# Patient Record
Sex: Female | Born: 1980 | Race: White | Hispanic: No | Marital: Married | Smoking: Former smoker
Health system: Southern US, Community
[De-identification: ages and names within clinical notes are randomized; demographics above are authoritative.]

## PROBLEM LIST (undated history)

## (undated) DIAGNOSIS — E119 Type 2 diabetes mellitus without complications: Secondary | ICD-10-CM

## (undated) DIAGNOSIS — J329 Chronic sinusitis, unspecified: Secondary | ICD-10-CM

## (undated) DIAGNOSIS — E663 Overweight: Secondary | ICD-10-CM

## (undated) DIAGNOSIS — F329 Major depressive disorder, single episode, unspecified: Secondary | ICD-10-CM

## (undated) DIAGNOSIS — I10 Essential (primary) hypertension: Secondary | ICD-10-CM

## (undated) DIAGNOSIS — F419 Anxiety disorder, unspecified: Secondary | ICD-10-CM

## (undated) DIAGNOSIS — I219 Acute myocardial infarction, unspecified: Secondary | ICD-10-CM

## (undated) DIAGNOSIS — F32A Depression, unspecified: Secondary | ICD-10-CM

## (undated) DIAGNOSIS — K219 Gastro-esophageal reflux disease without esophagitis: Secondary | ICD-10-CM

## (undated) DIAGNOSIS — M1990 Unspecified osteoarthritis, unspecified site: Secondary | ICD-10-CM

## (undated) DIAGNOSIS — G4730 Sleep apnea, unspecified: Secondary | ICD-10-CM

## (undated) HISTORY — DX: Type 2 diabetes mellitus without complications: E11.9

## (undated) HISTORY — DX: Morbid (severe) obesity due to excess calories: E66.01

## (undated) HISTORY — DX: Chronic sinusitis, unspecified: J32.9

## (undated) HISTORY — DX: Unspecified osteoarthritis, unspecified site: M19.90

## (undated) HISTORY — DX: Depression, unspecified: F32.A

## (undated) HISTORY — DX: Essential (primary) hypertension: I10

## (undated) HISTORY — DX: Overweight: E66.3

## (undated) HISTORY — DX: Sleep apnea, unspecified: G47.30

## (undated) HISTORY — DX: Major depressive disorder, single episode, unspecified: F32.9

## (undated) HISTORY — DX: Anxiety disorder, unspecified: F41.9

## (undated) HISTORY — DX: Gastro-esophageal reflux disease without esophagitis: K21.9

---

## 2005-12-10 ENCOUNTER — Emergency Department (HOSPITAL_COMMUNITY): Admission: EM | Admit: 2005-12-10 | Discharge: 2005-12-10 | Payer: Self-pay | Admitting: Emergency Medicine

## 2007-05-28 ENCOUNTER — Ambulatory Visit: Payer: Self-pay | Admitting: Internal Medicine

## 2007-05-28 LAB — CONVERTED CEMR LAB
Basophils Absolute: 0 10*3/uL (ref 0.0–0.1)
Eosinophils Absolute: 0.1 10*3/uL (ref 0.0–0.6)
MCHC: 35.4 g/dL (ref 30.0–36.0)
MCV: 81.4 fL (ref 78.0–100.0)
Monocytes Relative: 5.9 % (ref 3.0–11.0)
RBC: 4.49 M/uL (ref 3.87–5.11)
RDW: 12.3 % (ref 11.5–14.6)
Sed Rate: 19 mm/hr (ref 0–25)

## 2007-05-30 ENCOUNTER — Ambulatory Visit: Payer: Self-pay | Admitting: Internal Medicine

## 2007-05-30 ENCOUNTER — Encounter: Payer: Self-pay | Admitting: Internal Medicine

## 2009-09-14 ENCOUNTER — Encounter: Payer: Self-pay | Admitting: Cardiology

## 2009-09-14 HISTORY — PX: US ECHOCARDIOGRAPHY: HXRAD669

## 2010-04-17 IMAGING — CT CT ABD-PELV W/O CM
2 of 5 series · 13 of 42 positions shown, 19 images · non-contrast
Comparison: NONE

CLINICAL DATA: Jim:Dainiusn Pigaga, M.D. Flank pain.  Left lower 
quadrant  pain. 

CT ABDOMEN AND PELVIS WITHOUT INTRAVENOUS BUT FOLLOWING ORAL 
CONTRAST
TECHNIQUE: Multiple axial images were obtained from the diaphragm 
through the pelvis.

[Series 2: wo · axial · 0.73mm/px · z∈[+352,+732]mm · 10 of 94 slices shown, 16 images]
[im 9/94  soft-tissue]
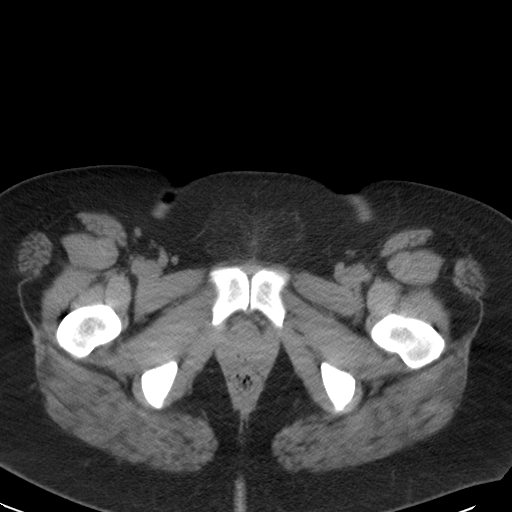
[im 9/94  bone]
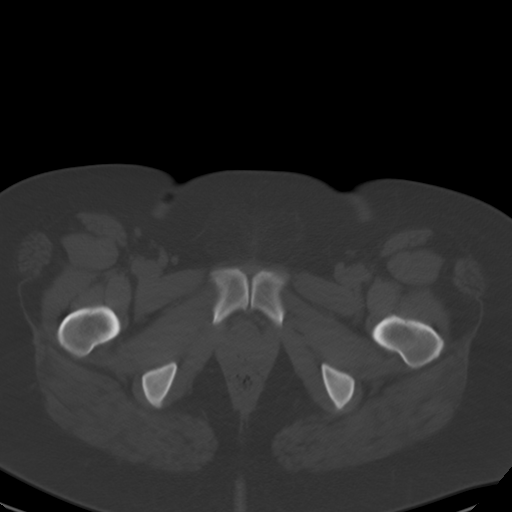
[im 17/94  soft-tissue]
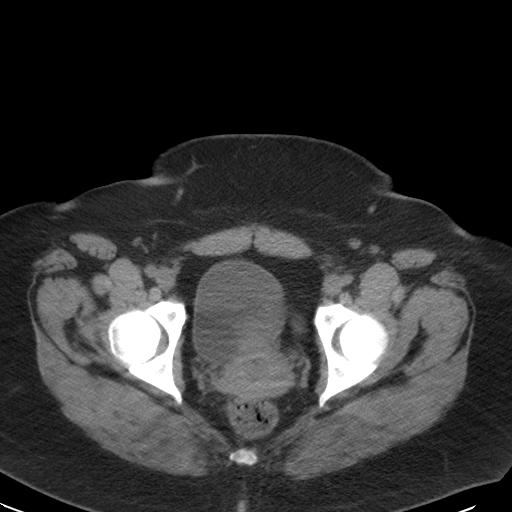
[im 26/94  soft-tissue]
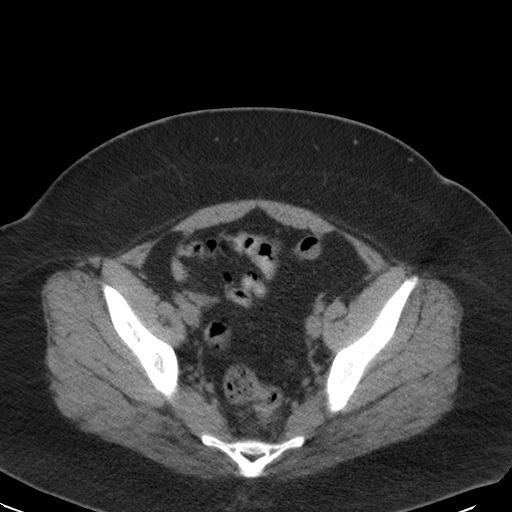
[im 34/94  soft-tissue]
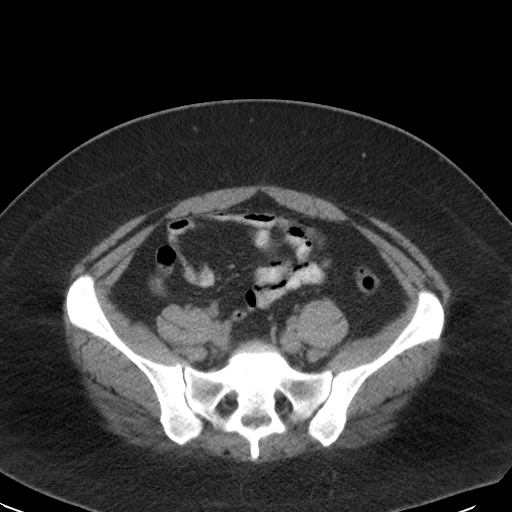
[im 43/94  soft-tissue]
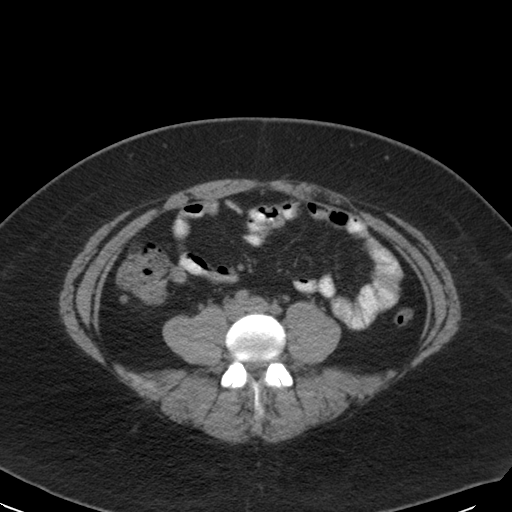
[im 51/94  soft-tissue]
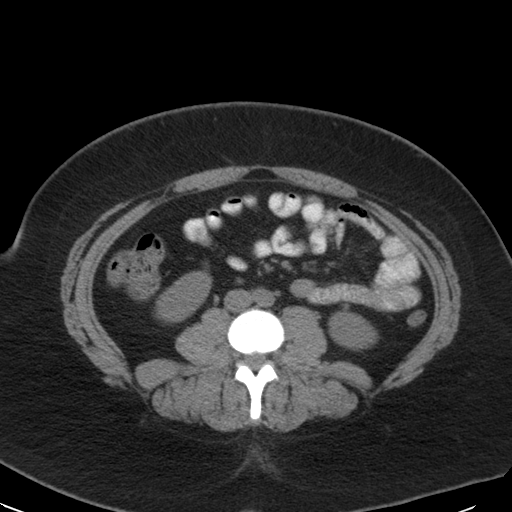
[im 60/94  soft-tissue]
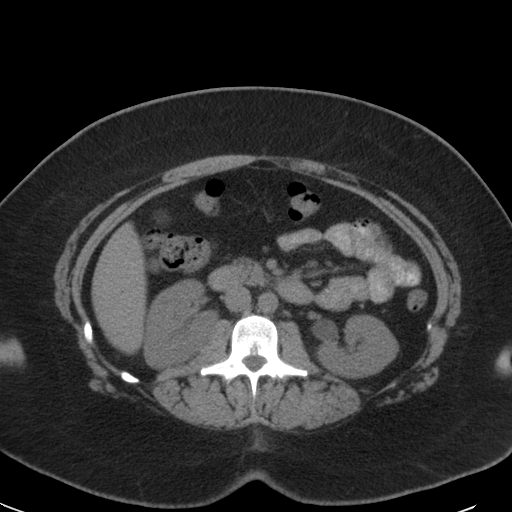
[im 60/94  lung]
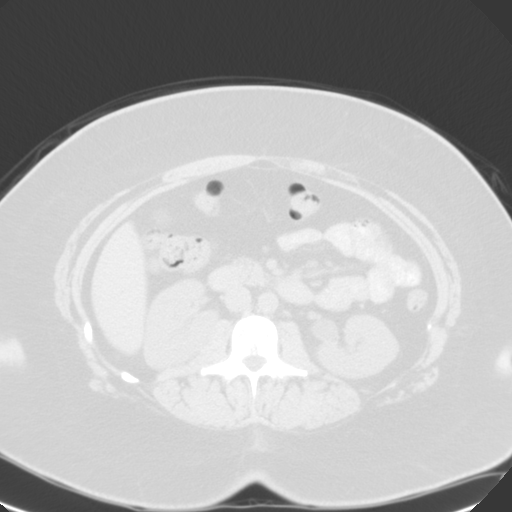
[im 68/94  soft-tissue]
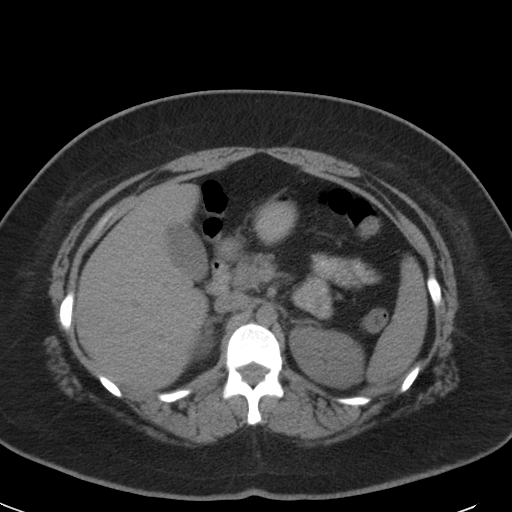
[im 68/94  lung]
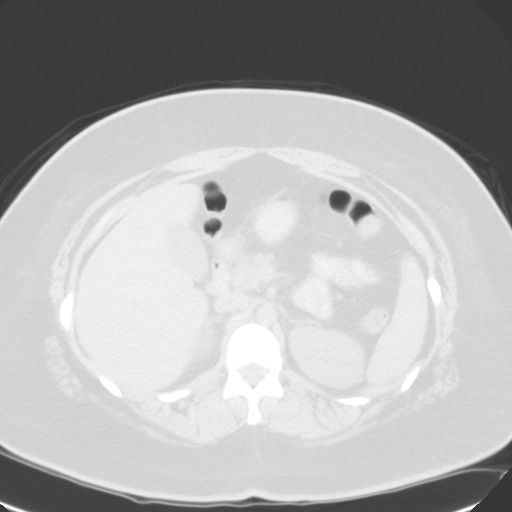
[im 77/94  soft-tissue]
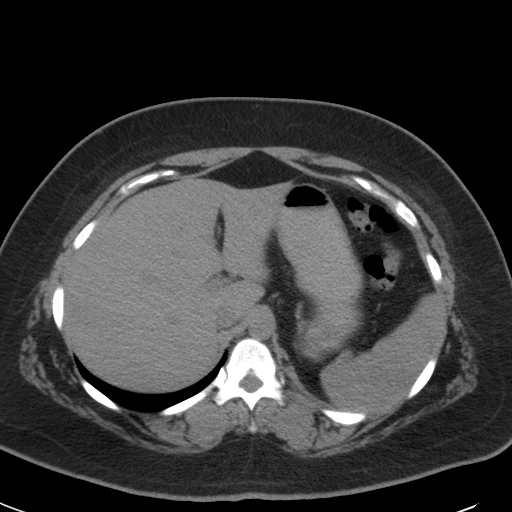
[im 77/94  lung]
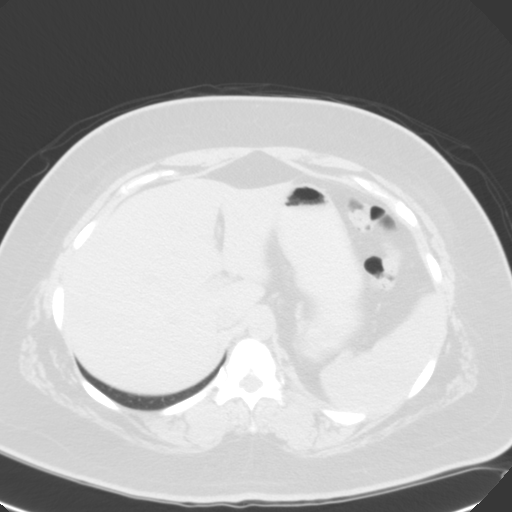
[im 77/94  bone]
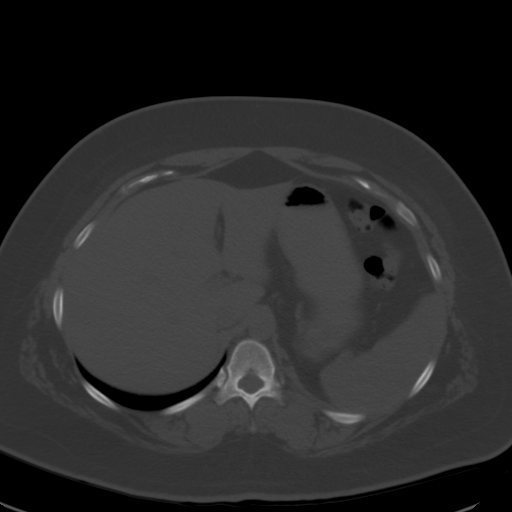
[im 85/94  soft-tissue]
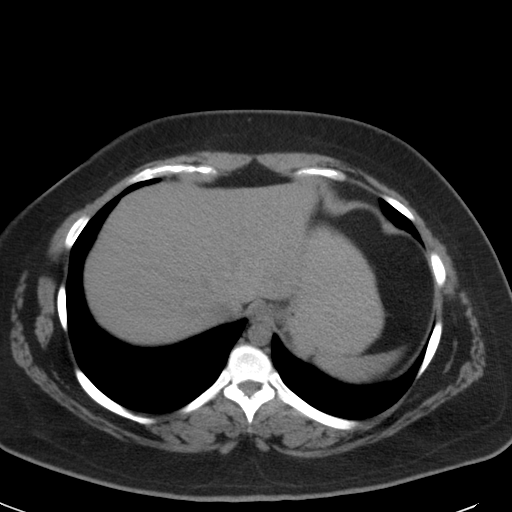
[im 85/94  lung]
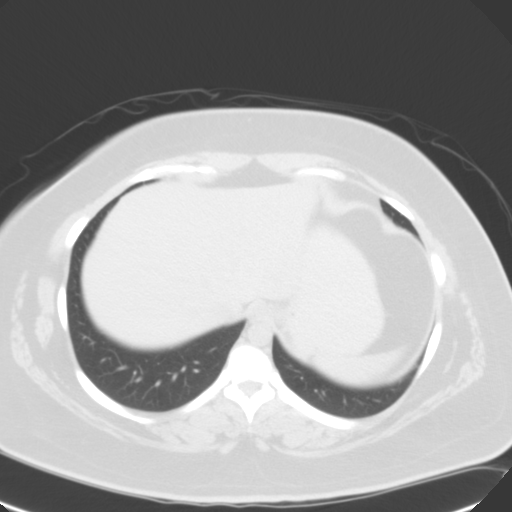

[coronals · coronal · 0.91mm/px · 3 of 75 slices shown]
[im 25/75  soft-tissue]
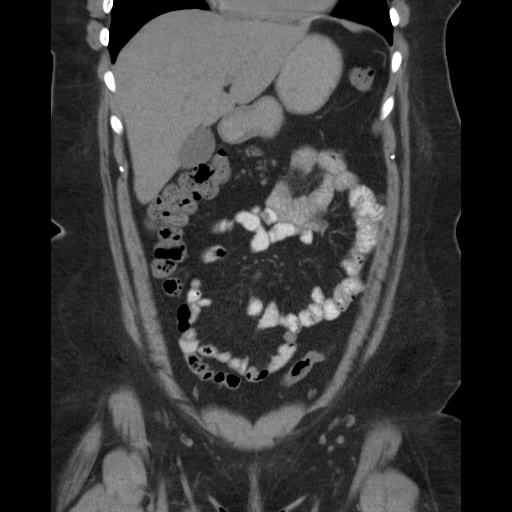
[im 33/75  soft-tissue]
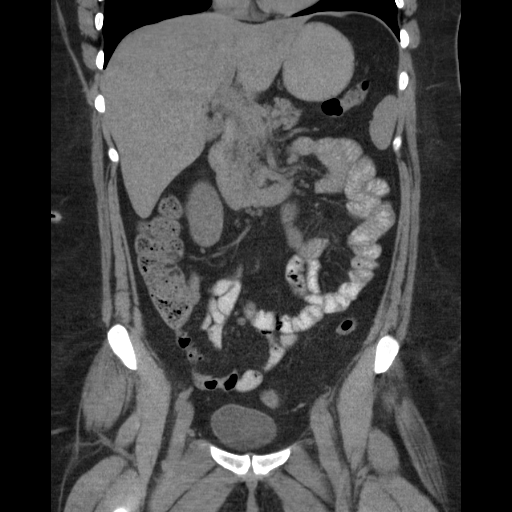
[im 42/75  soft-tissue]
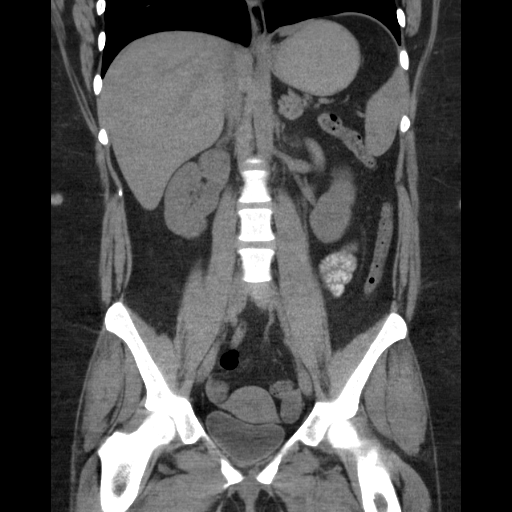

[13 of 42 positions shown; findings below may reference images not displayed]

FINDINGS: No gallstones or renal calculi are identified.  Liver, 
pancreas, spleen, kidneys, and aorta are unremarkable.  No bowel, 
mesenteric, pelvic, or inguinal mass, adenopathy, or inflammatory 
process.  No evidence of appendicitis, diverticulitis, hernia, or 
bowel obstruction.  No lung base mass, infiltrate, edema, or 
effusion.  No lytic or blastic lesions are identified. 

electronically reviewed on 06/04/2008 Dict Date: 06/04/2008  Tran 
Date:  06/04/2008 DAS  JLM

## 2010-09-27 ENCOUNTER — Encounter
Admission: RE | Admit: 2010-09-27 | Discharge: 2010-11-07 | Payer: Self-pay | Source: Home / Self Care | Attending: Obstetrics and Gynecology | Admitting: Obstetrics and Gynecology

## 2010-11-21 ENCOUNTER — Ambulatory Visit: Payer: Self-pay | Admitting: Dietician

## 2010-12-06 ENCOUNTER — Encounter: Payer: 59 | Attending: Obstetrics and Gynecology | Admitting: Dietician

## 2011-01-22 ENCOUNTER — Ambulatory Visit: Payer: 59 | Admitting: Dietician

## 2011-02-20 NOTE — Assessment & Plan Note (Signed)
[facility]                         GASTROENTEROLOGY OFFICE NOTE   Helen, Mcdaniel                       MRN:          [id number]  DATE:05/28/2007                            DOB:          [birth year]    GI CONSULTATION:  [name] is a 30 year old white female who is here  for evaluation of abdominal pain and diarrhea.  She saw Dr. Luther Parody in  April 2005 briefly and was thought to have a constipation-prone  irritable bowel syndrome.  Interestingly, she since then has developed  diarrhea, having 2 or 3 bowel movements a day, also at night.  She  denies any rectal bleeding.  Diarrhea is worse during her periods, which  have changed from lasting 4-5 days to lasting about 10 days.  She also  had frequent urination.  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  Unfortunately, she has to take personal time  ever time she runs to the bathroom, which seems to interfere with her  work.  Her weight has gradually increased.  In high school the patient  weighed 190 pounds.  She is currently 235 pounds.  She describes herself  as a rather nervous, anxious person, although she denies stress.  She  has been married recently and her husband is also overweight, and they  both eat out a lot.  She does not smoke, does not drink alcohol.   MEDICATIONS:  Benicar for blood pressure.   PAST HISTORY:  1. High blood pressure.  2. Asthmatic bronchitis.  3. Anxiety.  4. Depression.  5. Allergies.   She has never had any operations.   FAMILY HISTORY:  Heart disease in her father.  Breast cancer on mother's  side of the family.   SOCIAL HISTORY:  Married with no children.  She works at a Paediatric nurse.  She has a high school education.  The  patient stopped smoking in 2005.  She drinks alcohol very rarely.   REVIEW OF SYSTEMS:  Positive for being overweight, allergies, urination  changes, sleeping problems,  excessive thirst, vision changes, heart  rhythm problems, pain with periods, back pain, excessive urination,  severe fatigue, shortness of breath, muscle pains, and new depression.   PHYSICAL EXAMINATION:  Blood pressure 128/92, pulse 72, weight 235  pounds.  The patient was rather, nervous, anxious, but very pleasant and  cooperative.  She spoke incessantly.  SKIN:  Warm and dry.  There were no stigmata of chronic liver disease.  Sclerae were nonicteric.  Oral cavity was normal.  NECK:  Supple.  Thyroid was not enlarged.  LUNGS:  Clear to auscultation.  CARDIAC:  Normal S1, normal S2.  ABDOMEN:  Obese, soft, but very tender in the left lower quadrant and  left middle quadrant.  Normal exam in the epigastrium and right upper  and lower quadrants.  Liver edge was at the costal margin.  RECTAL:  Normal rectal tone.  Normal perianal area.  Stool was soft,  Hemoccult-negative.  EXTREMITIES:  No edema.   Abdominal ultrasound recently was normal.  IMPRESSION:  A 30 year old white female with alternating bowel habits  with predominant diarrhea, which is consistent with irritable bowel  syndrome.  This has been persistent for at least two years and the  patient is quite concerned about the possibility of inflammatory bowel  disease.  There is no family history of it.  Other possible explanations  would be celiac sprue but in the setting of obesity, it is highly  unlikely.  Her abdominal pain may be related to irritable bowel syndrome  but with the changing of her periods, one would consider the possibility  of endometriosis.  This needs to be further evaluated by a gynecologist.  She has not had one.  We have received a copy of the consultation report  from Dr. Luther Parody, and I was surprised to see that she actually saw him  for constipation rather than for diarrhea, which confirms my assumption  that we are truly dealing with irritable bowel syndrome.   PLAN:  1. We will proceed with  colonoscopy and mucosal biopsies to rule out      collagenous or microcytic colitis.  2. Will check a stool for lactoferrin and sedimentation rate as well      as CBC and tissue transglutaminase level.  3. Begin Bentyl 20 mg three times a day before meals.  This is for      antispasmodic effect.  She may eventually also need antianxiety or      psychotropic medications, but I would hold off on it at this time.  4. Gynecological evaluation for possible endometriosis and change in      the periods.     Hedwig Morton. Juanda Chance, MD  Electronically Signed    DMB/MedQ  DD: 05/28/2007  DT: 05/29/2007  Job #: [id number]   cc:   Harrel Lemon. Merla Riches, M.D.

## 2011-10-31 ENCOUNTER — Encounter (HOSPITAL_COMMUNITY): Payer: Self-pay | Admitting: Licensed Clinical Social Worker

## 2011-10-31 ENCOUNTER — Ambulatory Visit (INDEPENDENT_AMBULATORY_CARE_PROVIDER_SITE_OTHER): Payer: 59 | Admitting: Licensed Clinical Social Worker

## 2011-10-31 DIAGNOSIS — F4322 Adjustment disorder with anxiety: Secondary | ICD-10-CM
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
## 2011-11-08 ENCOUNTER — Ambulatory Visit (HOSPITAL_COMMUNITY): Payer: Self-pay | Admitting: Licensed Clinical Social Worker

## 2011-11-16 ENCOUNTER — Ambulatory Visit (INDEPENDENT_AMBULATORY_CARE_PROVIDER_SITE_OTHER): Payer: 59 | Admitting: Licensed Clinical Social Worker

## 2011-11-16 DIAGNOSIS — F411 Generalized anxiety disorder: Secondary | ICD-10-CM

## 2011-11-16 DIAGNOSIS — F419 Anxiety disorder, unspecified: Secondary | ICD-10-CM

## 2011-11-16 NOTE — Patient Instructions (Signed)
Talk with husband about having dinner together, looking into weight watchers program on computer, some exercise program, perhaps considering Dr. Referral for PT. Continue to find out cause of sinus problems Look around at the possibility of another apartment - purpose getting out of mold.If can't move right away - at least find an area you would like to live in.

## 2011-11-23 ENCOUNTER — Ambulatory Visit (INDEPENDENT_AMBULATORY_CARE_PROVIDER_SITE_OTHER): Payer: 59 | Admitting: Licensed Clinical Social Worker

## 2011-11-23 ENCOUNTER — Encounter (HOSPITAL_COMMUNITY): Payer: Self-pay | Admitting: Licensed Clinical Social Worker

## 2011-11-23 DIAGNOSIS — F4322 Adjustment disorder with anxiety: Secondary | ICD-10-CM

## 2011-11-23 NOTE — Patient Instructions (Signed)
Decide on a weight loss plan Work out bills by calling to make arrangements Continue to encourage husband to spend time with you Exercise when you can - first focus is getting food under control

## 2011-11-25 DIAGNOSIS — F4322 Adjustment disorder with anxiety: Secondary | ICD-10-CM | POA: Insufficient documentation
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
## 2011-12-04 ENCOUNTER — Encounter (HOSPITAL_COMMUNITY): Payer: Self-pay | Admitting: Licensed Clinical Social Worker

## 2011-12-04 ENCOUNTER — Ambulatory Visit (INDEPENDENT_AMBULATORY_CARE_PROVIDER_SITE_OTHER): Payer: 59 | Admitting: Licensed Clinical Social Worker

## 2011-12-04 DIAGNOSIS — F4322 Adjustment disorder with anxiety: Secondary | ICD-10-CM

## 2011-12-04 NOTE — Progress Notes (Signed)
   THERAPIST PROGRESS NOTE  Session Time: 3:00 - 4:00  Participation Level: Active  Behavioral Response: CasualAlertAnxious and Euthymic  Type of Therapy: Individual Therapy  Treatment Goals addressed: Anxiety  Interventions: Motivational Interviewing, Strength-based and Supportive  Summary: Helen Mcdaniel is a 31 y.o. female who presents with anxiety.  Today [name] reports that she is feeling less anxious and she feels that is because she feels that she has some attention; We discussed how when she first came she was (still is) working at home, not going out with friends and her husband was busy with school or his video games.  She was pretty lonely.[name] Her husband and she have been spending more time together.  They have specific TV shoes they watch and they have been eating together - they were not even eating together.   Her focus in this session was on her extended family.  She misses them a lot.  Her mother keeps her posted as to what is going on but she has trouble talking directly to her siblings for everyone is too busy.  She especially likes to connect with [name] and her brother and she has trouble with that because her brother does not keep in touch well and his wife is difficult to get through.  [name] also has a husband that keeps others away.  She has not sepent a Christmas with her family in quite awhile and she definitely wants to this year.  Her parents come down once a year but her siblings do not come so she has to see them by going up to [place].[name]  Her husband works Engineering geologist so it is hard to get away during holidays.  She is going to make more of an effort.  She loves [name] except that her family are not here.  Her husband is from her so he has his family close by.  She likes his mother but sometimes [name] does not understand her humor and takes the wrong things personally. She is continuing to work on a food plan to lose weight for she would like to have a child and she is considered a high  risk for a pregnancy.  .  Suicidal/Homicidal: Nowithout intent/plan  Plan: Return again in 1 weeks.  Diagnosis: Axis I: Adjustment Disorder with Anxiety    Axis II: Deferred    [name],JUDITH A, LCSW 12/04/2011

## 2011-12-07 NOTE — Progress Notes (Signed)
   THERAPIST PROGRESS NOTE  Session Time: 3:00 - 4:00  Participation Level: Active  Behavioral Response: CasualAlertAnxious and Euthymic  Type of Therapy: Individual Therapy  Treatment Goals addressed: Anxiety  Interventions: Motivational Interviewing, Supportive and Reframing  Summary: Helen Mcdaniel is a 31 y.o. female who presents with anxiety from stress in her life.  Husband having difficulties because of having two back surgeries and he is obese.  She discussed how little time they have together because when he is home he is playing video games and they don't even eat together.  Discussed how their relationship has been over the years and that they need to get back in touch with each other.  She sounds like she is lonely.  She works mainly at home so has limited contact with others and she and her husband are distant.  Because of his back issues and her chronic allergies and sinusitis they only have sex once or ticwice a mont.  They do want to have children one day.  She feels that they do not have any emotional issues with each other just bad habits. He is going to school and hopes to be finished in 3 semesters. The mold in their mobile home is really causing her to have problems.  She needs to look into how to protect herself from this mold.  They own this mobile home but they rent the land.  They are planning to break down the home and get rid of it when it is paid off. It is hard to figure out what she can do in the meantime - she should at least see if there is a way to get rid of the mold or some way to protect herself..  .   Suicidal/Homicidal: Nowithout intent/plan  Plan: Return again in 1 weeks.  Diagnosis: Axis I: Adjustment Disorder with Anxiety    Axis II: Deferred    Helen Mcdaniel,JUDITH A, LCSW 12/07/2011

## 2011-12-12 ENCOUNTER — Encounter (HOSPITAL_COMMUNITY): Payer: Self-pay | Admitting: Licensed Clinical Social Worker

## 2011-12-12 ENCOUNTER — Ambulatory Visit (INDEPENDENT_AMBULATORY_CARE_PROVIDER_SITE_OTHER): Payer: 59 | Admitting: Licensed Clinical Social Worker

## 2011-12-12 DIAGNOSIS — F4322 Adjustment disorder with anxiety: Secondary | ICD-10-CM

## 2011-12-13 ENCOUNTER — Encounter: Payer: Self-pay | Admitting: *Deleted

## 2011-12-20 NOTE — Progress Notes (Signed)
   THERAPIST PROGRESS NOTE  Session Time: 3:05 - 4:00  Participation Level: Active  Behavioral Response: CasualAlertAnxious and Euthymic  Type of Therapy: Individual Therapy  Treatment Goals addressed: Anxiety  Interventions: Motivational Interviewing, Strength-based and Supportive  Summary: RABECKA BRENDEL is a 31 y.o. female who presents with anxiety in relation to extended family and work.   Today Yazmen is much calmer.  The situation with her father is stable - it is a matter of clearing out toxins from the damage to his liver.  He is still yellow but home and improving. She wishes her family was closer - she likes communicating with siblings - perhaps more than they do - she is the youngest and sometimes feels like a pest - may come from earlier childhood.  She and her husband are spending more time together which is positive.  Eating together and watching TV.  He does not seem as interested in losing weight as she is - it would be more productive if they could do together.  They are both obese and it would be helpful to their health and the process they are thinking about which is having children.  There are watching TV and eating together.  He realized he was in a bad habit.  She is now able to pursue another job in her company and she is pleased with that prospect.  She is excited about new possibilities.  She likes working for [employer].  In terms of original crisis she is complete but she would like to continue in therapy.  Discussed how she need to define the problems she would like to work on and we could re: contract for time together.  Believe she will have issues around weight loss.     Suicidal/Homicidal: Nowithout intent/plan  Plan: Return again in 2 weeks.  Diagnosis: Axis I: Anxiety Disorder NOS    Axis II: Deferred    Cate Oravec,JUDITH A, LCSW 12/20/2011

## 2012-02-06 ENCOUNTER — Ambulatory Visit (HOSPITAL_COMMUNITY): Payer: Self-pay | Admitting: Licensed Clinical Social Worker

## 2012-03-08 ENCOUNTER — Encounter: Payer: Self-pay | Admitting: Cardiology

## 2012-07-02 ENCOUNTER — Other Ambulatory Visit: Payer: Self-pay | Admitting: Internal Medicine

## 2012-07-03 NOTE — Telephone Encounter (Signed)
Chart pulled to PA pool at nurse station [phone]

## 2012-11-10 ENCOUNTER — Ambulatory Visit: Payer: 59 | Admitting: Emergency Medicine

## 2012-11-10 VITALS — BP 140/80 | HR 83 | Temp 97.1°F | Resp 16 | Ht 62.0 in | Wt 256.2 lb

## 2012-11-10 DIAGNOSIS — F411 Generalized anxiety disorder: Secondary | ICD-10-CM

## 2012-11-10 DIAGNOSIS — F419 Anxiety disorder, unspecified: Secondary | ICD-10-CM

## 2012-11-10 DIAGNOSIS — G4733 Obstructive sleep apnea (adult) (pediatric): Secondary | ICD-10-CM

## 2012-11-10 DIAGNOSIS — M545 Low back pain: Secondary | ICD-10-CM

## 2012-11-10 DIAGNOSIS — R300 Dysuria: Secondary | ICD-10-CM

## 2012-11-10 LAB — POCT UA - MICROSCOPIC ONLY
Bacteria, U Microscopic: NEGATIVE
Casts, Ur, LPF, POC: NEGATIVE
Crystals, Ur, HPF, POC: NEGATIVE
Mucus, UA: NEGATIVE
Yeast, UA: NEGATIVE

## 2012-11-10 LAB — POCT URINALYSIS DIPSTICK
Bilirubin, UA: NEGATIVE
Blood, UA: NEGATIVE
Glucose, UA: NEGATIVE
Ketones, UA: NEGATIVE
Leukocytes, UA: NEGATIVE
Nitrite, UA: NEGATIVE
Protein, UA: NEGATIVE
Spec Grav, UA: 1.02
Urobilinogen, UA: 0.2
pH, UA: 7.5

## 2012-11-10 MED ORDER — SERTRALINE HCL 50 MG PO TABS: 50.0000 mg | ORAL_TABLET | Freq: Every day | ORAL | Status: DC

## 2012-11-10 MED ORDER — ALBUTEROL SULFATE HFA 108 (90 BASE) MCG/ACT IN AERS: 2.0000 | INHALATION_SPRAY | RESPIRATORY_TRACT | Status: DC | PRN

## 2012-11-10 NOTE — Patient Instructions (Addendum)
Obesity Obesity is defined as having too much total body fat and a body mass index (BMI) of 30 or more. BMI is an estimate of body fat and is calculated from your height and weight. Obesity happens when you consume more calories than you can burn by exercising or performing daily physical tasks. Prolonged obesity can cause major illnesses or emergencies, such as:   A stroke.  Heart disease.  Diabetes.  Cancer.  Arthritis.  High blood pressure (hypertension).  High cholesterol.  Sleep apnea.  Erectile dysfunction.  Infertility problems. CAUSES   Regularly eating unhealthy foods.  Physical inactivity.  Certain disorders, such as an underactive thyroid (hypothyroidism), Cushing's syndrome, and polycystic ovarian syndrome.  Certain medicines, such as steroids, some depression medicines, and antipsychotics.  Genetics.  Lack of sleep. DIAGNOSIS  A caregiver can diagnose obesity after calculating your BMI. Obesity will be diagnosed if your BMI is 30 or higher.  There are other methods of measuring obesity levels. Some other methods include measuring your skin fold thickness, your waist circumference, and comparing your hip circumference to your waist circumference. TREATMENT  A healthy treatment program includes some or all of the following:  Long-term dietary changes.  Exercise and physical activity.  Behavioral and lifestyle changes.  Medicine only under the supervision of your caregiver. Medicines may help, but only if they are used with diet and exercise programs. An unhealthy treatment program includes:  Fasting.  Fad diets.  Supplements and drugs. These choices do not succeed in long-term weight control.  HOME CARE INSTRUCTIONS   Exercise and perform physical activity as directed by your caregiver. To increase physical activity, try the following:  Use stairs instead of elevators.  Park farther away from store entrances.  Garden, bike, or walk instead of  watching television or using the computer.  Eat healthy, low-calorie foods and drinks on a regular basis. Eat more fruits and vegetables. Use low-calorie cookbooks or take healthy cooking classes.  Limit fast food, sweets, and processed snack foods.  Eat smaller portions.  Keep a daily journal of everything you eat. There are many free websites to help you with this. It may be helpful to measure your foods so you can determine if you are eating the correct portion sizes.  Avoid drinking alcohol. Drink more water and drinks without calories.  Take vitamins and supplements only as recommended by your caregiver.  Weight-loss support groups, Registered Dieticians, counselors, and stress reduction education can also be very helpful. SEEK IMMEDIATE MEDICAL CARE IF:  You have chest pain or tightness.  You have trouble breathing or feel short of breath.  You have weakness or leg numbness.  You feel confused or have trouble talking.  You have sudden changes in your vision. MAKE SURE YOU:  Understand these instructions.  Will watch your condition.  Will get help right away if you are not doing well or get worse. Document Released: 11/01/2004 Document Revised: 03/25/2012 Document Reviewed: 10/31/2011 Exit[employer] Patient Information 2013 [employer].  

## 2012-11-10 NOTE — Progress Notes (Signed)
[facility] [address] [place] [address] [id number]  Date:  11/10/2012   Name:  Helen Mcdaniel   DOB:  1981-02-05   MRN:  [id number]  PCP:  Tonye Pearson, MD    Chief Complaint: Dysuria   History of Present Illness:  Helen Mcdaniel is a 32 y.o. very pleasant female patient who presents with the following:  Numerous complaints.  Has daytime sleepiness and tired all the time. Snores at night.  Sedentary.  Says she does not sleep well at night.  Has history of anxiety and depression that was treated with zoloft.  She stopped her medication back in December.  She is increasingly symptomatic off the medication.  No suicidal thought or thoughts of harm to others.  Dysuria and urgency.  No fever or chills.  No nausea or vomiting.  No GYN symptoms.  Has low back pain associated with her dysuria.  Has wheezing and cough associated with bronchospasm.  Not using albuterol  Patient Active Problem List  Diagnosis  . Adjustment disorder with anxiety    Past Medical History  Diagnosis Date  . Asthma   . High blood pressure   . Overweight   . Sinusitis   . Morbid obesity   . Anxiety   . Depression   . GERD (gastroesophageal reflux disease)   . Diabetes mellitus without complication     Past Surgical History  Procedure Date  . US echocardiography 09/14/2009    EF 55-60%    History  Substance Use Topics  . Smoking status: Former Smoker    Quit date: 10/08/2005  . Smokeless tobacco: Never Used  . Alcohol Use: Yes     Comment: In the past had an alcohol    Family History  Problem Relation Age of Onset  . Hypertension Mother   . Hyperlipidemia Mother   . Heart attack Father   . Coronary artery disease Father   . Hyperlipidemia Father     No Known Allergies  Medication list has been reviewed and updated.  Current Outpatient Prescriptions on File Prior to Visit  Medication Sig Dispense Refill  . methyldopa (ALDOMET) 250 MG tablet Take  250 mg by mouth once.      [name] lisinopril-hydrochlorothiazide (PRINZIDE,ZESTORETIC) 10-12.5 MG per tablet Take 1 tablet by mouth daily.      . sertraline (ZOLOFT) 25 MG tablet Take 25 mg by mouth daily.      . sertraline (ZOLOFT) 50 MG tablet Take 1 tablet (50 mg total) by mouth daily. Needs office visit  30 tablet  0    Review of Systems:  As per HPI, otherwise negative.    Physical Examination: Filed Vitals:   11/10/12 1153  BP: 140/80  Pulse: 83  Temp: 97.1 F (36.2 C)  Resp: 16   Filed Vitals:   11/10/12 1153  Height: 5\' 2"  (1.575 m)  Weight: 256 lb 3.2 oz (116.212 kg)   Body mass index is 46.86 kg/(m^2). Ideal Body Weight: Weight in (lb) to have BMI = 25: 136.4   GEN: WDWN, NAD, Non-toxic, A & O x 3 HEENT: Atraumatic, Normocephalic. Neck supple. No masses, No LAD. Ears and Nose: No external deformity. CV: RRR, No M/G/R. No JVD. No thrill. No extra heart sounds. PULM: CTA B, no wheezes, crackles, rhonchi. No retractions. No resp. distress. No accessory muscle use. ABD: S, NT, ND, +BS. No rebound. No HSM. EXTR: No c/c/e NEURO Normal gait.  PSYCH: Normally interactive. Conversant.  Not depressed or anxious appearing.  Calm demeanor.    Assessment and Plan: Morbid obesity Hypertension by history Anxiety disorder Asthma Lose weight Refill meds Stop antihypertensives Follow up in 3 months  Carmelina Dane, MD  Results for orders placed in visit on 11/10/12  POCT UA - MICROSCOPIC ONLY      Component Value Range   WBC, Ur, HPF, POC rare     RBC, urine, microscopic rare     Bacteria, U Microscopic neg     Mucus, UA neg     Epithelial cells, urine per micros 0-1     Crystals, Ur, HPF, POC neg     Casts, Ur, LPF, POC neg     Yeast, UA neg    POCT URINALYSIS DIPSTICK      Component Value Range   Color, UA yellow     Clarity, UA clear     Glucose, UA neg     Bilirubin, UA neg     Ketones, UA neg     Spec Grav, UA 1.020     Blood, UA neg     pH, UA 7.5      Protein, UA neg     Urobilinogen, UA 0.2     Nitrite, UA neg     Leukocytes, UA Negative

## 2012-11-10 NOTE — Progress Notes (Signed)
Reviewed and agree.

## 2013-01-05 ENCOUNTER — Other Ambulatory Visit: Payer: Self-pay | Admitting: *Deleted

## 2013-01-05 DIAGNOSIS — G4730 Sleep apnea, unspecified: Secondary | ICD-10-CM

## 2013-01-05 DIAGNOSIS — R0683 Snoring: Secondary | ICD-10-CM

## 2013-01-06 ENCOUNTER — Ambulatory Visit (INDEPENDENT_AMBULATORY_CARE_PROVIDER_SITE_OTHER): Payer: 59 | Admitting: Neurology

## 2013-01-06 VITALS — Ht 62.0 in | Wt 256.0 lb

## 2013-01-06 DIAGNOSIS — G4733 Obstructive sleep apnea (adult) (pediatric): Secondary | ICD-10-CM

## 2013-01-16 ENCOUNTER — Other Ambulatory Visit: Payer: Self-pay | Admitting: *Deleted

## 2013-01-16 DIAGNOSIS — R0683 Snoring: Secondary | ICD-10-CM

## 2013-01-16 DIAGNOSIS — G4730 Sleep apnea, unspecified: Secondary | ICD-10-CM

## 2013-01-21 ENCOUNTER — Telehealth: Payer: Self-pay | Admitting: *Deleted

## 2013-01-21 ENCOUNTER — Other Ambulatory Visit: Payer: Self-pay | Admitting: *Deleted

## 2013-01-21 DIAGNOSIS — G4733 Obstructive sleep apnea (adult) (pediatric): Secondary | ICD-10-CM

## 2013-01-21 NOTE — Telephone Encounter (Deleted)
Error

## 2013-01-21 NOTE — Telephone Encounter (Signed)
Pt returned call re: sleep study results, discussed OSA and Dr. Oliva Bustard recommendations, pt prefers to have in lab titration due to (she feels like she will not follow through well with the auto titration at home, knows she will need support).  She would like [place] to contact her regarding her OOP expense so she knows what to expect, I told her we would get an authorization anyway and call her and let her know.  Appt scheduled for 01/28/2013 in one week. -sh

## 2013-01-21 NOTE — Progress Notes (Signed)
Dr. Vickey Huger writes:  Pt with moderate OSA needs to return for CPAP Titration (may also have autotitration if preferred per notes).  [place] okay'd for return for CPAP Titration if needed. -sh

## 2013-01-21 NOTE — Telephone Encounter (Deleted)
error 

## 2013-01-21 NOTE — Telephone Encounter (Signed)
LM on home voicemail regarding results ready from sleep test, Dr. Vickey Huger has recommendations to 1. Return for CPAP Titration in lab or 2.  Have auto-titration at home for a trial.  Asked her to call us to discuss further. -sh

## 2013-01-28 ENCOUNTER — Ambulatory Visit (INDEPENDENT_AMBULATORY_CARE_PROVIDER_SITE_OTHER): Payer: 59 | Admitting: *Deleted

## 2013-01-28 DIAGNOSIS — G4710 Hypersomnia, unspecified: Secondary | ICD-10-CM

## 2013-01-28 DIAGNOSIS — R0609 Other forms of dyspnea: Secondary | ICD-10-CM

## 2013-01-28 DIAGNOSIS — G4733 Obstructive sleep apnea (adult) (pediatric): Secondary | ICD-10-CM

## 2013-02-09 ENCOUNTER — Other Ambulatory Visit: Payer: Self-pay | Admitting: *Deleted

## 2013-02-09 DIAGNOSIS — G4733 Obstructive sleep apnea (adult) (pediatric): Secondary | ICD-10-CM

## 2013-03-30 ENCOUNTER — Encounter: Payer: Self-pay | Admitting: Neurology

## 2013-04-17 ENCOUNTER — Encounter: Payer: Self-pay | Admitting: Neurology

## 2013-04-17 ENCOUNTER — Ambulatory Visit (INDEPENDENT_AMBULATORY_CARE_PROVIDER_SITE_OTHER): Payer: 59 | Admitting: Neurology

## 2013-04-17 VITALS — BP 150/95 | HR 78 | Resp 20 | Ht 61.5 in | Wt 259.0 lb

## 2013-04-17 DIAGNOSIS — G4733 Obstructive sleep apnea (adult) (pediatric): Secondary | ICD-10-CM

## 2013-04-17 DIAGNOSIS — G4730 Sleep apnea, unspecified: Secondary | ICD-10-CM | POA: Insufficient documentation

## 2013-04-17 NOTE — Progress Notes (Signed)
[facility]  Provider:  Dr Jaquavis Felmlee Referring Provider: Tonye Pearson, MD Primary Care Physician:  Tonye Pearson, MD  Chief Complaint  Patient presents with  . New Evaluation    sleep consult, rm 10    HPI:  Helen Mcdaniel is a 32 y.o. female here as a referral from Dr. Merla Riches for a sleep consultation.  Helen Mcdaniel is a 32 year old Caucasian, right-handed female who presented on 4-1 2014 for a polysomnography. Due to a high body mass index the study was performed with the measures to avoid hot obesity related hypoventilation. CO2 retention was not found but an AHI of 18.5 and an RDI of 22 point sleep are identified. During REM sleep AHI was 66.8 per hour of sleep and in supine sleep the AHI was 27.5. She also had an ALT oxygen saturation at 81% during supine REM sleep at all for all or he can minutes of desaturations. However the constellation of REM accentuated apnea and or oxygen levels and responds would place this patient in a category that is best served with CPAP and not as responsve to  a dental device or positional therapy alone. The patient originally presented to in the is a history of morbid obesity, asthma, hypertension, GERD, diabetes mellitus with possible beginning neuropathy according to Dr. Merla Riches, fatigue anxiety and depression history. Her sleep is affected by fragmentation, nocturia lots snoring and she felt excessively daytime sleepy. Her previous CPAP Epworth score was 12 points Becks  inventory elevated at 11 points  Today's Epworth score is 3 points on CPAP the patient has used her machine 4/75% of the allotted time he used it on 29/30 days with an average she was at home of 50 was inserted 6 minutes her AHI is 1.3, she believes she will longer snores she doesn't wake up as a dry mouth at the same degree as she did before using CPAP. The patient currently uses an "FX for  Her" nasal months the manufacturer's ResMed  small size. The  patient struggles with allergic rhinitis and sinusitis and would be interested in trying an air-fit P. 10 nasal pillow,  or Eson by [employer].  The patient works and is in school parallel bedtime begins around midnight she rises normally around 6:30 or 7 AM, since using CPAP she is waking up once or none, but before CPAP she had very fragmented sleep. She estimates her nocturnal sleep time before CPAP he was at at 4 less than 5 hours on average. She is now gaining at least 6 or 6-1/2 hours. As is reflected in her Epworth sleepiness score.  The patient got married and in 2007 started to gain weight which she attributes to the development of major depression at the time. She also stated that she couldn't find the energy to start a meaningful nutritional change in her life. She's not a regular exerciser. Dr Merla Riches advised her that she is on the way to becoming diabetic but is not yet there- she is looking for nutitrional and exercise advise. She seems to have " slipped  Into that obesity " she stated, and snored for at least 2 years.  Both spouses report the other as a  snorer and have observed  the other one having apneas. Helen Mcdaniel husband has also reported her sleep talking / sleep walking and doing sometimes complex things at night.  This has improved drastically with her as overall sleep time increased.        Review of Systems:  Out of a complete 14 system review, the patient complains of only the following symptoms, and all other reviewed systems are negative.  epworth 3 , FSS 43 points.   History   Social History  . Marital Status: Married    Spouse Name: N/A    Number of Children: 0  . Years of Education: college   Occupational History  . [employer]    Social History Main Topics  . Smoking status: Former Smoker    Quit date: 10/09/2007  . Smokeless tobacco: Never Used  . Alcohol Use: Yes     Comment: occas.  . Drug Use: No  . Sexually Active: Yes     Birth Control/ Protection: None   Other Topics Concern  . Not on file   Social History Narrative  . No narrative on file    Family History  Problem Relation Age of Onset  . Hypertension Mother   . Hyperlipidemia Mother   . Heart attack Father   . Coronary artery disease Father   . Hyperlipidemia Father     Past Medical History  Diagnosis Date  . Asthma   . High blood pressure   . Overweight(278.02)   . Sinusitis   . Morbid obesity   . Anxiety   . Depression   . GERD (gastroesophageal reflux disease)   . Diabetes mellitus without complication   . Sleep apnea with use of continuous positive airway pressure (CPAP)     01-09-13 , AHI 18.5 , titrated 6 cm , BMI 46.8 .    Past Surgical History  Procedure Laterality Date  . US echocardiography  09/14/2009    EF 55-60%    Current Outpatient Prescriptions  Medication Sig Dispense Refill  . albuterol (PROVENTIL HFA;VENTOLIN HFA) 108 (90 BASE) MCG/ACT inhaler Inhale 2 puffs into the lungs every 4 (four) hours as needed for wheezing (cough, shortness of breath or wheezing.).  1 Inhaler  12  . lisinopril-hydrochlorothiazide (PRINZIDE,ZESTORETIC) 10-12.5 MG per tablet Take 1 tablet by mouth daily.      . methyldopa (ALDOMET) 250 MG tablet Take 250 mg by mouth once.      . sertraline (ZOLOFT) 25 MG tablet Take 25 mg by mouth daily.      . sertraline (ZOLOFT) 50 MG tablet Take 1 tablet (50 mg total) by mouth daily. Needs office visit  30 tablet  5   No current facility-administered medications for this visit.    Allergies as of 04/17/2013  . (No Known Allergies)    Vitals: BP 150/95  Pulse 78  Resp 20  Ht 5' 1.5" (1.562 m)  Wt 259 lb (117.482 kg)  BMI 48.15 kg/m2 Last Weight:  Wt Readings from Last 1 Encounters:  04/17/13 259 lb (117.482 kg)   Last Height:   Ht Readings from Last 1 Encounters:  04/17/13 5' 1.5" (1.562 m)   Vision Screening:    Physical exam:  General: The patient is awake, alert and appears not  in acute distress. The patient is well groomed. Head: Normocephalic, atraumatic. Neck is supple. Mallampati 4  neck circumference 47 cm,  Retrognathia -  No braces.   Cardiovascular:  Regular rate and rhythm, without  murmurs or carotid bruit, and without distended neck veins. Respiratory: Lungs are clear to auscultation. Skin:  Without evidence of edema, or rash Trunk: BMI is morbidly  elevated but patient  has normal posture.  Neurologic exam : The patient is awake and alert, oriented to place and time.  Memory  subjective  described as intact. There is a normal attention span & concentration ability. Speech is fluent without  dysarthria, dysphonia or aphasia. Mood and affect are appropriate.  Cranial nerves: Pupils are equal and briskly reactive to light. Funduscopic exam without  evidence of pallor or edema. Extraocular movements  in vertical and horizontal planes intact and without nystagmus. Visual fields by finger perimetry are intact. Hearing to finger rub intact.  Facial sensation intact to fine touch. Facial motor strength is symmetric and tongue and uvula move midline.  Motor exam:  Normal tone and normal muscle bulk and symmetric normal strength in all extremities.  Sensory:  Fine touch, pinprick and vibration were tested in all extremities. Proprioception is  normal.  Coordination: Rapid alternating movements in the fingers/hands is tested and normal. Finger-to-nose maneuver  without evidence of ataxia, dysmetria or tremor.  Gait and station: Patient walks without assistive device . Strength within normal limits. Stance is stable and normal.  Steps are unfragmented. Romberg testing is normal.  Deep tendon reflexes: in the  upper and lower extremities are symmetric and intact. Babinski maneuver response is  Downgoing.  The patient has a allergic rhinitis and allergic asthma condition and tested positive for mildew allergy.  #2 continue use of CPAP for the treatment of her apnea  the patient at this time is compliant. I will offer her a different mask to try to improve tolerance and comfort. We again addressed the high BMI, depression may have led to an eating disorder, but there is also a hormonal imbalance - has very irregular menstrual cycles. She likely has polycystic ovarian syndrome. She is at high risk of developing diabetes. Gave her  information about the and 5 date diet,  intermittend fasting.    Assessment:  After physical and neurologic examination, review of laboratory studies, imaging, neurophysiology testing and pre-existing records, as above - OSA , morbid obesity , PCOS. Depression.      Plan:  Treatment plan  reviewed under Problem List.

## 2013-04-17 NOTE — Patient Instructions (Signed)
Exercise to Lose Weight Exercise and a healthy diet may help you lose weight. Your doctor may suggest specific exercises. EXERCISE IDEAS AND TIPS  Choose low-cost things you enjoy doing, such as walking, bicycling, or exercising to workout videos.  Take stairs instead of the elevator.  Walk during your lunch break.  Park your car further away from work or school.  Go to a gym or an exercise class.  Start with 5 to 10 minutes of exercise each day. Build up to 30 minutes of exercise 4 to 6 days a week.  Wear shoes with good support and comfortable clothes.  Stretch before and after working out.  Work out until you breathe harder and your heart beats faster.  Drink extra water when you exercise.  Do not do so much that you hurt yourself, feel dizzy, or get very short of breath. Exercises that burn about 150 calories:  Running 1  miles in 15 minutes.  Playing volleyball for 45 to 60 minutes.  Washing and waxing a car for 45 to 60 minutes.  Playing touch football for 45 minutes.  Walking 1  miles in 35 minutes.  Pushing a stroller 1  miles in 30 minutes.  Playing basketball for 30 minutes.  Raking leaves for 30 minutes.  Bicycling 5 miles in 30 minutes.  Walking 2 miles in 30 minutes.  Dancing for 30 minutes.  Shoveling snow for 15 minutes.  Swimming laps for 20 minutes.  Walking up stairs for 15 minutes.  Bicycling 4 miles in 15 minutes.  Gardening for 30 to 45 minutes.  Jumping rope for 15 minutes.  Washing windows or floors for 45 to 60 minutes. Document Released: 10/27/2010 Document Revised: 12/17/2011 Document Reviewed: 10/27/2010 [facility] Patient Information 2014 [place], [place]. Obesity Obesity is having too much body fat and a body mass index (BMI) of 30 or more. BMI is a number based on your height and weight. The number is an estimate of how much body fat you have. Obesity can happen if you eat more calories than you can burn by exercising or  other activity. It can cause major health problems or emergencies.  HOME CARE  Exercise and be active as told by your doctor. Try:  Using stairs when you can.  Parking farther away from store doors.  Gardening, biking, or walking.  Eat healthy foods and drinks that are low in calories. Eat more fruits and vegetables.  Limit fast food, sweets, and snack foods that are made with ingredients that are not natural (processed food).  Eat smaller amounts of food.  Keep a journal and write down what you eat every day. Websites can help with this.  Avoid drinking alcohol. Drink more water and drinks without calories.   Take vitamins and dietary pills (supplements) only as told by your doctor.  Try going to weight-loss support groups or classes to help lessen stress. Dieticians and counselors may also help. GET HELP RIGHT AWAY IF:  You have chest pain or tightness.  You have trouble breathing or feel short of breath.  You feel weak or have loss of feeling (numbness) in your legs.  You feel confused or have trouble talking.  You have sudden changes in your vision. MAKE SURE YOU:  Understand these instructions.  Will watch your condition.  Will get help right away if you are not doing well or get worse. Document Released: 12/17/2011 Document Reviewed: 12/17/2011 [facility] Patient Information 2014 [place], [place]. Sleep Apnea Sleep apnea is disorder that affects  a person's sleep. A person with sleep apnea has abnormal pauses in their breathing when they sleep. It is hard for them to get a good sleep. This makes a person tired during the day. It also can lead to other physical problems. There are three types of sleep apnea. One type is when breathing stops for a short time because your airway is blocked (obstructive sleep apnea). Another type is when the brain sometimes fails to give the normal signal to breathe to the muscles that control your breathing (central sleep apnea). The third  type is a combination of the other two types. HOME CARE  Do not sleep on your back. Try to sleep on your side.  Take all medicine as told by your doctor.  Avoid alcohol, calming medicines (sedatives), and depressant drugs.  Try to lose weight if you are overweight. Talk to your doctor about a healthy weight goal. Your doctor may have you use a device that helps to open your airway. It can help you get the air that you need. It is called a positive airway pressure (PAP) device. There are three types of PAP devices:  Continuous positive airway pressure (CPAP) device.  Nasal expiratory positive airway pressure (EPAP) device.  Bilevel positive airway pressure (BPAP) device. MAKE SURE YOU:  Understand these instructions.  Will watch your condition.  Will get help right away if you are not doing well or get worse. Document Released: 07/03/2008 Document Revised: 09/10/2012 Document Reviewed: 01/26/2012 [facility] Patient Information 2014 [place], [place].

## 2013-05-09 ENCOUNTER — Encounter: Payer: Self-pay | Admitting: Neurology

## 2013-05-19 ENCOUNTER — Ambulatory Visit: Payer: 59 | Admitting: Emergency Medicine

## 2013-05-19 VITALS — BP 140/94 | HR 71 | Temp 97.7°F | Resp 16 | Ht 61.5 in | Wt 248.0 lb

## 2013-05-19 DIAGNOSIS — F32A Depression, unspecified: Secondary | ICD-10-CM

## 2013-05-19 DIAGNOSIS — F329 Major depressive disorder, single episode, unspecified: Secondary | ICD-10-CM

## 2013-05-19 DIAGNOSIS — G4733 Obstructive sleep apnea (adult) (pediatric): Secondary | ICD-10-CM

## 2013-05-19 MED ORDER — SERTRALINE HCL 50 MG PO TABS: 50.0000 mg | ORAL_TABLET | Freq: Every day | ORAL | Status: AC

## 2013-05-19 NOTE — Progress Notes (Signed)
[facility] [address], [place] [address] [phone]  Date:  05/19/2013   Name:  Helen Mcdaniel   DOB:  Apr 22, 1981   MRN:  [id number]  PCP:  Tonye Pearson, MD    Chief Complaint: Panic Attack and Depression   History of Present Illness:  Helen Mcdaniel is a 32 y.o. very pleasant female patient who presents with the following:  Not compliant with medications.  Stopped for 3 weeks ago.  Anxious and depressed. Unhappy that she is here and not in [place].  Not happy with her weight, house, and lack of a baby.  Full of magical thoughts.  Not suicidal.  Not using CPAP.  Has more energy when she is compliant.  No improvement with over the counter medications or other home remedies.  No suicidal thoughts or ideation or harm to others.    Patient Active Problem List   Diagnosis Date Noted  . Sleep apnea with use of continuous positive airway pressure (CPAP)   . Adjustment disorder with anxiety 11/25/2011    Class: Acute    Past Medical History  Diagnosis Date  . Asthma   . High blood pressure   . Overweight(278.02)   . Sinusitis   . Morbid obesity   . Anxiety   . Depression   . GERD (gastroesophageal reflux disease)   . Diabetes mellitus without complication   . Sleep apnea with use of continuous positive airway pressure (CPAP)     01-09-13 , AHI 18.5 , titrated 6 cm , BMI 46.8 .  [name] Arthritis     Past Surgical History  Procedure Laterality Date  . US echocardiography  09/14/2009    EF 55-60%    History  Substance Use Topics  . Smoking status: Former Smoker    Quit date: 10/09/2007  . Smokeless tobacco: Never Used  . Alcohol Use: Yes     Comment: occas.    Family History  Problem Relation Age of Onset  . Hypertension Mother   . Hyperlipidemia Mother   . Heart attack Father   . Coronary artery disease Father   . Hyperlipidemia Father     No Known Allergies  Medication list has been reviewed and updated.  Current Outpatient  Prescriptions on File Prior to Visit  Medication Sig Dispense Refill  . albuterol (PROVENTIL HFA;VENTOLIN HFA) 108 (90 BASE) MCG/ACT inhaler Inhale 2 puffs into the lungs every 4 (four) hours as needed for wheezing (cough, shortness of breath or wheezing.).  1 Inhaler  12  . sertraline (ZOLOFT) 50 MG tablet Take 1 tablet (50 mg total) by mouth daily. Needs office visit  30 tablet  5  . lisinopril-hydrochlorothiazide (PRINZIDE,ZESTORETIC) 10-12.5 MG per tablet Take 1 tablet by mouth daily.      . methyldopa (ALDOMET) 250 MG tablet Take 250 mg by mouth once.      . sertraline (ZOLOFT) 25 MG tablet Take 25 mg by mouth daily.       No current facility-administered medications on file prior to visit.    Review of Systems:  As per HPI, otherwise negative.    Physical Examination: Filed Vitals:   05/19/13 1338  BP: 140/94  Pulse: 71  Temp: 97.7 F (36.5 C)  Resp: 16   Filed Vitals:   05/19/13 1338  Height: 5' 1.5" (1.562 m)  Weight: 248 lb (112.492 kg)   Body mass index is 46.11 kg/(m^2). Ideal Body Weight: Weight in (lb) to have BMI =  25: 134.2  GEN: WDWN, NAD, Non-toxic, A & O x 3 HEENT: Atraumatic, Normocephalic. Neck supple. No masses, No LAD. Ears and Nose: No external deformity. CV: RRR, No M/G/R. No JVD. No thrill. No extra heart sounds. PULM: CTA B, no wheezes, crackles, rhonchi. No retractions. No resp. distress. No accessory muscle use. ABD: S, NT, ND, +BS. No rebound. No HSM. EXTR: No c/c/e NEURO Normal gait.  PSYCH: Normally interactive. Conversant. Not depressed or anxious appearing.  Calm demeanor.    Assessment and Plan: Depression  Anxiety OSA Non compliance   Signed,  Phillips Odor, MD

## 2013-05-19 NOTE — Patient Instructions (Addendum)

## 2013-06-26 ENCOUNTER — Encounter: Payer: Self-pay | Admitting: Neurology

## 2013-10-11 ENCOUNTER — Ambulatory Visit: Payer: 59 | Admitting: Internal Medicine

## 2013-10-11 VITALS — BP 130/90 | HR 77 | Temp 98.8°F | Resp 16 | Ht 61.25 in | Wt 257.0 lb

## 2013-10-11 DIAGNOSIS — N926 Irregular menstruation, unspecified: Secondary | ICD-10-CM

## 2013-10-11 DIAGNOSIS — J019 Acute sinusitis, unspecified: Secondary | ICD-10-CM

## 2013-10-11 DIAGNOSIS — J301 Allergic rhinitis due to pollen: Secondary | ICD-10-CM

## 2013-10-11 LAB — POCT URINE PREGNANCY: Preg Test, Ur: NEGATIVE

## 2013-10-11 MED ORDER — HYDROCODONE-HOMATROPINE 5-1.5 MG/5ML PO SYRP: 5.0000 mL | ORAL_SOLUTION | Freq: Four times a day (QID) | ORAL | Status: DC | PRN

## 2013-10-11 MED ORDER — FLUTICASONE PROPIONATE 50 MCG/ACT NA SUSP: 2.0000 | Freq: Every day | NASAL | Status: DC

## 2013-10-11 MED ORDER — AMOXICILLIN 500 MG PO CAPS: 1000.0000 mg | ORAL_CAPSULE | Freq: Two times a day (BID) | ORAL | Status: AC

## 2013-10-11 NOTE — Progress Notes (Signed)
Subjective:    Patient ID: Helen Mcdaniel, female    DOB: 26-Apr-1981, 33 y.o.   MRN: [id number]  HPI This chart was scribed for [name]- by Ladona Ridgel Day, Scribe. This patient was seen in room 9 and the patient's care was started at 2:41 PM.  HPI Comments: Helen Mcdaniel is a 33 y.o. female who presents to the [facility] complaining of recurrent sinus infections that have been occuring over the past 2 months. She states this began in November when she had flu like symptoms and since this time has been having sinus infections, nasal congestion, sinus pressure, ear discomfort (right ear worse than left), fever (only yesterday), cough (keeps her up at night). She has been using Tylenol for fever and mucinex for her cough. She reports that she has dx of sleep apnea and has difficulty sleeping if she does not use her mask and has been having trouble breathing through her nose secondary to nasal congestion. She also reports that she has been having diarrhea w/her current illness. She states unsure of what is causing this problem and did not have diarrhea with any of her other illnesses over the past 2 months. She also reports asthma flare ups w/exertion but has done well w/her at home albuterol inhaler which she states still has about 120 doses remaining.    Patient Active Problem List   Diagnosis Date Noted  . Sleep apnea with use of continuous positive airway pressure (CPAP)   . Adjustment disorder with anxiety 11/25/2011    Class: Acute    Past Surgical History  Procedure Laterality Date  . US echocardiography  09/14/2009    EF 55-60%    Family History  Problem Relation Age of Onset  . Hypertension Mother   . Hyperlipidemia Mother   . Heart attack Father   . Coronary artery disease Father   . Hyperlipidemia Father     History   Social History  . Marital Status: Married    Spouse Name: N/A    Number of Children: 0  . Years of Education: college    Occupational History  . [employer]    Social History Main Topics  . Smoking status: Former Smoker    Quit date: 10/09/2007  . Smokeless tobacco: Never Used  . Alcohol Use: Yes     Comment: occas.  . Drug Use: No  . Sexual Activity: Yes    Birth Control/ Protection: None   Other Topics Concern  . Not on file   Social History Narrative  . No narrative on file    No Known Allergies  Results for orders placed in visit on 10/11/13  POCT URINE PREGNANCY      Result Value Range   Preg Test, Ur Negative     Review of Systems  Constitutional: Positive for fever.  HENT: Positive for congestion, ear pain and sinus pressure. Negative for sore throat.   Respiratory: Positive for cough. Negative for shortness of breath.   Cardiovascular: Negative for chest pain.  Gastrointestinal: Positive for diarrhea. Negative for nausea, vomiting and abdominal pain.  Musculoskeletal: Negative for back pain.  Skin: Negative for color change.       Objective:   Physical Exam  Nursing note and vitals reviewed. Constitutional: She is oriented to person, place, and time. She appears well-developed and well-nourished. No distress.  HENT:  Head: Normocephalic and atraumatic.  Eyes: Conjunctivae are normal. Right eye exhibits no discharge. Left eye exhibits  no discharge.  Neck: Normal range of motion.  Cardiovascular: Normal rate.   Pulmonary/Chest: Effort normal. No respiratory distress.  Musculoskeletal: Normal range of motion. She exhibits no edema.  Neurological: She is alert and oriented to person, place, and time.  Skin: Skin is warm and dry.  Psychiatric: She has a normal mood and affect. Thought content normal.   Triage Vitals: BP 130/90  Pulse 77  Temp(Src) 98.8 F (37.1 C) (Oral)  Resp 16  Ht 5' 1.25" (1.556 m)  Wt 257 lb (116.574 kg)  BMI 48.15 kg/m2  SpO2 97%  LMP 08/31/2013  DIAGNOSTIC STUDIES: Oxygen Saturation is 97% on room air, normal by my interpretation.     COORDINATION OF CARE: At 240 PM Discussed treatment plan with patient which includes AMX, flonase. Patient agrees.      Assessment & Plan:  I have completed the patient encounter in its entirety as documented by the scribe, with editing by me where necessary. Natoshia Souter P. Merla Richesoolittle, M.D. Acute sinusitis, unspecified  Missed period - Plan: POCT urine pregnancy  Allergic rhinitis due to pollen  Obesity, morbid, BMI 40.0-49.9  Meds ordered this encounter  Medications  . amoxicillin (AMOXIL) 500 MG capsule    Sig: Take 2 capsules (1,000 mg total) by mouth 2 (two) times daily.    Dispense:  40 capsule    Refill:  0  . fluticasone (FLONASE) 50 MCG/ACT nasal spray    Sig: Place 2 sprays into both nostrils daily.    Dispense:  16 g    Refill:  11  . HYDROcodone-homatropine (HYCODAN) 5-1.5 MG/5ML syrup    Sig: Take 5 mLs by mouth every 6 (six) hours as needed for cough.    Dispense:  120 mL    Refill:  0

## 2014-01-12 ENCOUNTER — Ambulatory Visit: Payer: 59 | Admitting: Neurology

## 2014-02-20 ENCOUNTER — Ambulatory Visit: Payer: 59

## 2014-02-20 ENCOUNTER — Ambulatory Visit: Payer: 59 | Admitting: Internal Medicine

## 2014-02-20 VITALS — BP 160/88 | HR 86 | Temp 98.0°F | Resp 16 | Ht 60.75 in | Wt 264.2 lb

## 2014-02-20 DIAGNOSIS — R030 Elevated blood-pressure reading, without diagnosis of hypertension: Secondary | ICD-10-CM

## 2014-02-20 DIAGNOSIS — R635 Abnormal weight gain: Secondary | ICD-10-CM

## 2014-02-20 DIAGNOSIS — R350 Frequency of micturition: Secondary | ICD-10-CM

## 2014-02-20 DIAGNOSIS — R109 Unspecified abdominal pain: Secondary | ICD-10-CM

## 2014-02-20 DIAGNOSIS — N912 Amenorrhea, unspecified: Secondary | ICD-10-CM

## 2014-02-20 LAB — POCT UA - MICROSCOPIC ONLY
Bacteria, U Microscopic: NEGATIVE
Casts, Ur, LPF, POC: NEGATIVE
Crystals, Ur, HPF, POC: NEGATIVE
RBC, URINE, MICROSCOPIC: NEGATIVE
WBC, UR, HPF, POC: NEGATIVE
YEAST UA: NEGATIVE

## 2014-02-20 LAB — POCT CBC
Granulocyte percent: 71.1 %G (ref 37–80)
HEMATOCRIT: 43.4 % (ref 37.7–47.9)
Hemoglobin: 14.1 g/dL (ref 12.2–16.2)
Lymph, poc: 2.9 (ref 0.6–3.4)
MCH: 28.1 pg (ref 27–31.2)
MCHC: 32.5 g/dL (ref 31.8–35.4)
MCV: 86.5 fL (ref 80–97)
MID (cbc): 0.5 (ref 0–0.9)
MPV: 9.1 fL (ref 0–99.8)
POC Granulocyte: 8.5 — AB (ref 2–6.9)
POC LYMPH PERCENT: 24.5 %L (ref 10–50)
POC MID %: 4.4 %M (ref 0–12)
Platelet Count, POC: 552 10*3/uL — AB (ref 142–424)
RBC: 5.02 M/uL (ref 4.04–5.48)
RDW, POC: 13 %
WBC: 12 10*3/uL — AB (ref 4.6–10.2)

## 2014-02-20 LAB — POCT URINALYSIS DIPSTICK
Bilirubin, UA: NEGATIVE
Blood, UA: NEGATIVE
GLUCOSE UA: NEGATIVE
KETONES UA: NEGATIVE
LEUKOCYTES UA: NEGATIVE
Nitrite, UA: NEGATIVE
PROTEIN UA: NEGATIVE
SPEC GRAV UA: 1.025
Urobilinogen, UA: 0.2
pH, UA: 6.5

## 2014-02-20 LAB — POCT SEDIMENTATION RATE: POCT SED RATE: 36 mm/hr — AB (ref 0–22)

## 2014-02-20 LAB — POCT URINE PREGNANCY: Preg Test, Ur: NEGATIVE

## 2014-02-20 MED ORDER — PB-HYOSCY-ATROPINE-SCOPOLAMINE 16.2 MG PO TABS: ORAL_TABLET | ORAL | Status: AC

## 2014-02-20 MED ORDER — POLYETHYLENE GLYCOL 3350 17 GM/SCOOP PO POWD: 17.0000 g | Freq: Two times a day (BID) | ORAL | Status: AC | PRN

## 2014-02-20 NOTE — Progress Notes (Addendum)
Subjective:    Patient ID: Helen Mcdaniel, female    DOB: [birth year], 33 y.o.   MRN: [id number]  This chart was scribed for Tami Lin, MD by Delphia Grates, ED Scribe. This patient was seen in room Room/bed 13 and the patient's care was started at 3:27 PM.   HPI HPI Comments: Helen Mcdaniel is a 33 y.o. female who presents to the [facility] complaining of constipation onset 3 weeks ago. Patient states she "feels she has a blockage".  She describes the stool as "squishy" and states her bowel movements alternate between constipation and diarrhea. There is associated lower abdonminal pain, lower back pain, nausea, and urinary frequency. Patient states that when she pushes, there is strain in her right side and lower back. She also states she has not had a menstrual period since her last visit in January. She reports upon using the restroom, there is minimal amount of blood when wiping. However, she states this is not consistent with her menstrual period. Patient denies emesis or other urinary symptoms. Patient reports she had a pregnancy test and test for UTI performed 2 days ago, however, both tests resulted negative. Patient has no history of abdonimal surgery. She states she have did have a colonoscopy in 2006. She also reports high blood pressure, but has stopped taking her albuterol because she states her blood pressure "stays at 140".   Review of Systems  Gastrointestinal: Positive for nausea, abdominal pain, diarrhea and constipation. Negative for vomiting.  Genitourinary: Positive for frequency, vaginal bleeding and menstrual problem.  Musculoskeletal: Positive for back pain.  All other systems reviewed and are negative.      Objective:   Physical Exam  Nursing note and vitals reviewed. Constitutional: She is oriented to person, place, and time. She appears well-developed and well-nourished. No distress.  Morbidly obese.  HENT:  Head: Normocephalic and  atraumatic.  Eyes: EOM are normal. Pupils are equal, round, and reactive to light.  Neck: Neck supple. No thyromegaly present.  Cardiovascular: Normal rate.   Pulmonary/Chest: Effort normal. No respiratory distress.  Abdominal: Soft. Bowel sounds are normal. She exhibits no distension.  Mildly tender across all the lower quadrants. No rebound. No CVA tenderness.  Musculoskeletal: Normal range of motion. She exhibits no edema.  Straight leg raise negative.  Lymphadenopathy:    She has no cervical adenopathy.  Neurological: She is alert and oriented to person, place, and time.  Skin: Skin is warm and dry.  Psychiatric: She has a normal mood and affect. Her behavior is normal.    Wt Readings from Last 3 Encounters:  02/20/14 264 lb 4 oz (119.863 kg)  10/11/13 257 lb (116.574 kg)  05/19/13 248 lb (112.492 kg)   BP 160/88   Pulse 86   Temp(Src) 98 F (36.7 C) (Oral)   Resp 16   Ht 5' 0.75" (1.543 m)   Wt 264 lb 4 oz (119.863 kg)   BMI 50.34 kg/m2   SpO2 98%   LMP 08/31/2013   Results for orders placed in visit on 02/20/14  POCT URINALYSIS DIPSTICK      Result Value Ref Range   Color, UA yellow     Clarity, UA clear     Glucose, UA neg     Bilirubin, UA neg     Ketones, UA neg     Spec Grav, UA 1.025     Blood, UA neg     pH, UA 6.5  Protein, UA neg     Urobilinogen, UA 0.2     Nitrite, UA neg     Leukocytes, UA Negative    POCT UA - MICROSCOPIC ONLY      Result Value Ref Range   WBC, Ur, HPF, POC neg     RBC, urine, microscopic neg     Bacteria, U Microscopic neg     Mucus, UA large     Epithelial cells, urine per micros 0-3     Crystals, Ur, HPF, POC neg     Casts, Ur, LPF, POC neg     Yeast, UA neg    POCT CBC      Result Value Ref Range   WBC 12.0 (*) 4.6 - 10.2 K/uL   Lymph, poc 2.9  0.6 - 3.4   POC LYMPH PERCENT 24.5  10 - 50 %L   MID (cbc) 0.5  0 - 0.9   POC MID % 4.4  0 - 12 %M   POC Granulocyte 8.5 (*) 2 - 6.9   Granulocyte percent 71.1  37 - 80 %G    RBC 5.02  4.04 - 5.48 M/uL   Hemoglobin 14.1  12.2 - 16.2 g/dL   HCT, POC 43.4  37.7 - 47.9 %   MCV 86.5  80 - 97 fL   MCH, POC 28.1  27 - 31.2 pg   MCHC 32.5  31.8 - 35.4 g/dL   RDW, POC 13.0     Platelet Count, POC 552 (*) 142 - 424 K/uL   MPV 9.1  0 - 99.8 fL  POCT URINE PREGNANCY      Result Value Ref Range   Preg Test, Ur Negative     [facility] reading (PRIMARY) by  Dr. Doolittle=nonspecific abd-poorly penetrated//      Assessment & Plan:  Urinary frequency - Plan: POCT urinalysis dipstick, POCT UA - Microscopic Only  Amenorrhea - Plan: POCT CBC, POCT urine pregnancy  Abdominal pain - Plan: POCT CBC, POCT urine pregnancy, POCT SEDIMENTATION RATE, DG Abd 1 View  Blood pressure elevated without history of HTN - Plan: Comprehensive metabolic panel  Weight gain - Plan: TSH, Follicle Stimulating Hormone, Luteinizing hormone  Meds ordered this encounter  Medications   fluticasone (FLONASE) 50 MCG/ACT nasal spray    Sig: Place 2 sprays into both nostrils daily as needed.   bell[place]ona alk-PHENObarbital (DONNATAL) 16.2 MG tablet    Sig: 1 ac and hs for 2-4 weeks    Dispense:  60 tablet    Refill:  1   polyethylene glycol powder (GLYCOLAX/MIRALAX) powder    Sig: Take 17 g by mouth 2 (two) times daily as needed.    Dispense:  255 g    Refill:  1   Repeat BP 1-2 weeks--get outside readings ?GYN next if labs ok   I have completed the patient encounter in its entirety as documented by the scribe, with editing by me where necessary. Robert P. Laney Pastor, M.D.

## 2014-02-21 LAB — COMPREHENSIVE METABOLIC PANEL
ALT: 14 U/L (ref 0–35)
AST: 15 U/L (ref 0–37)
Albumin: 4.3 g/dL (ref 3.5–5.2)
Alkaline Phosphatase: 72 U/L (ref 39–117)
BUN: 8 mg/dL (ref 6–23)
CALCIUM: 9.6 mg/dL (ref 8.4–10.5)
CHLORIDE: 98 meq/L (ref 96–112)
CO2: 29 meq/L (ref 19–32)
CREATININE: 0.71 mg/dL (ref 0.50–1.10)
GLUCOSE: 95 mg/dL (ref 70–99)
Potassium: 4.3 mEq/L (ref 3.5–5.3)
Sodium: 137 mEq/L (ref 135–145)
Total Bilirubin: 0.6 mg/dL (ref 0.2–1.2)
Total Protein: 7.5 g/dL (ref 6.0–8.3)

## 2014-02-21 LAB — LUTEINIZING HORMONE: LH: 4.2 m[IU]/mL

## 2014-02-21 LAB — TSH: TSH: 1.747 u[IU]/mL (ref 0.350–4.500)

## 2014-02-21 LAB — FOLLICLE STIMULATING HORMONE: FSH: 5.9 m[IU]/mL

## 2014-02-24 ENCOUNTER — Other Ambulatory Visit: Payer: Self-pay | Admitting: Radiology

## 2014-02-24 DIAGNOSIS — N926 Irregular menstruation, unspecified: Secondary | ICD-10-CM

## 2015-09-05 ENCOUNTER — Encounter: Payer: Self-pay | Admitting: Internal Medicine

## 2016-01-05 IMAGING — CR DG ABDOMEN 1V
1 series · 1 of 1 positions shown · non-contrast
Comparison: CT abdomen and pelvis June 02, 2008

CLINICAL DATA: Abdominal pain and constipation

EXAM:
ABDOMEN - 1 VIEW

[AP]
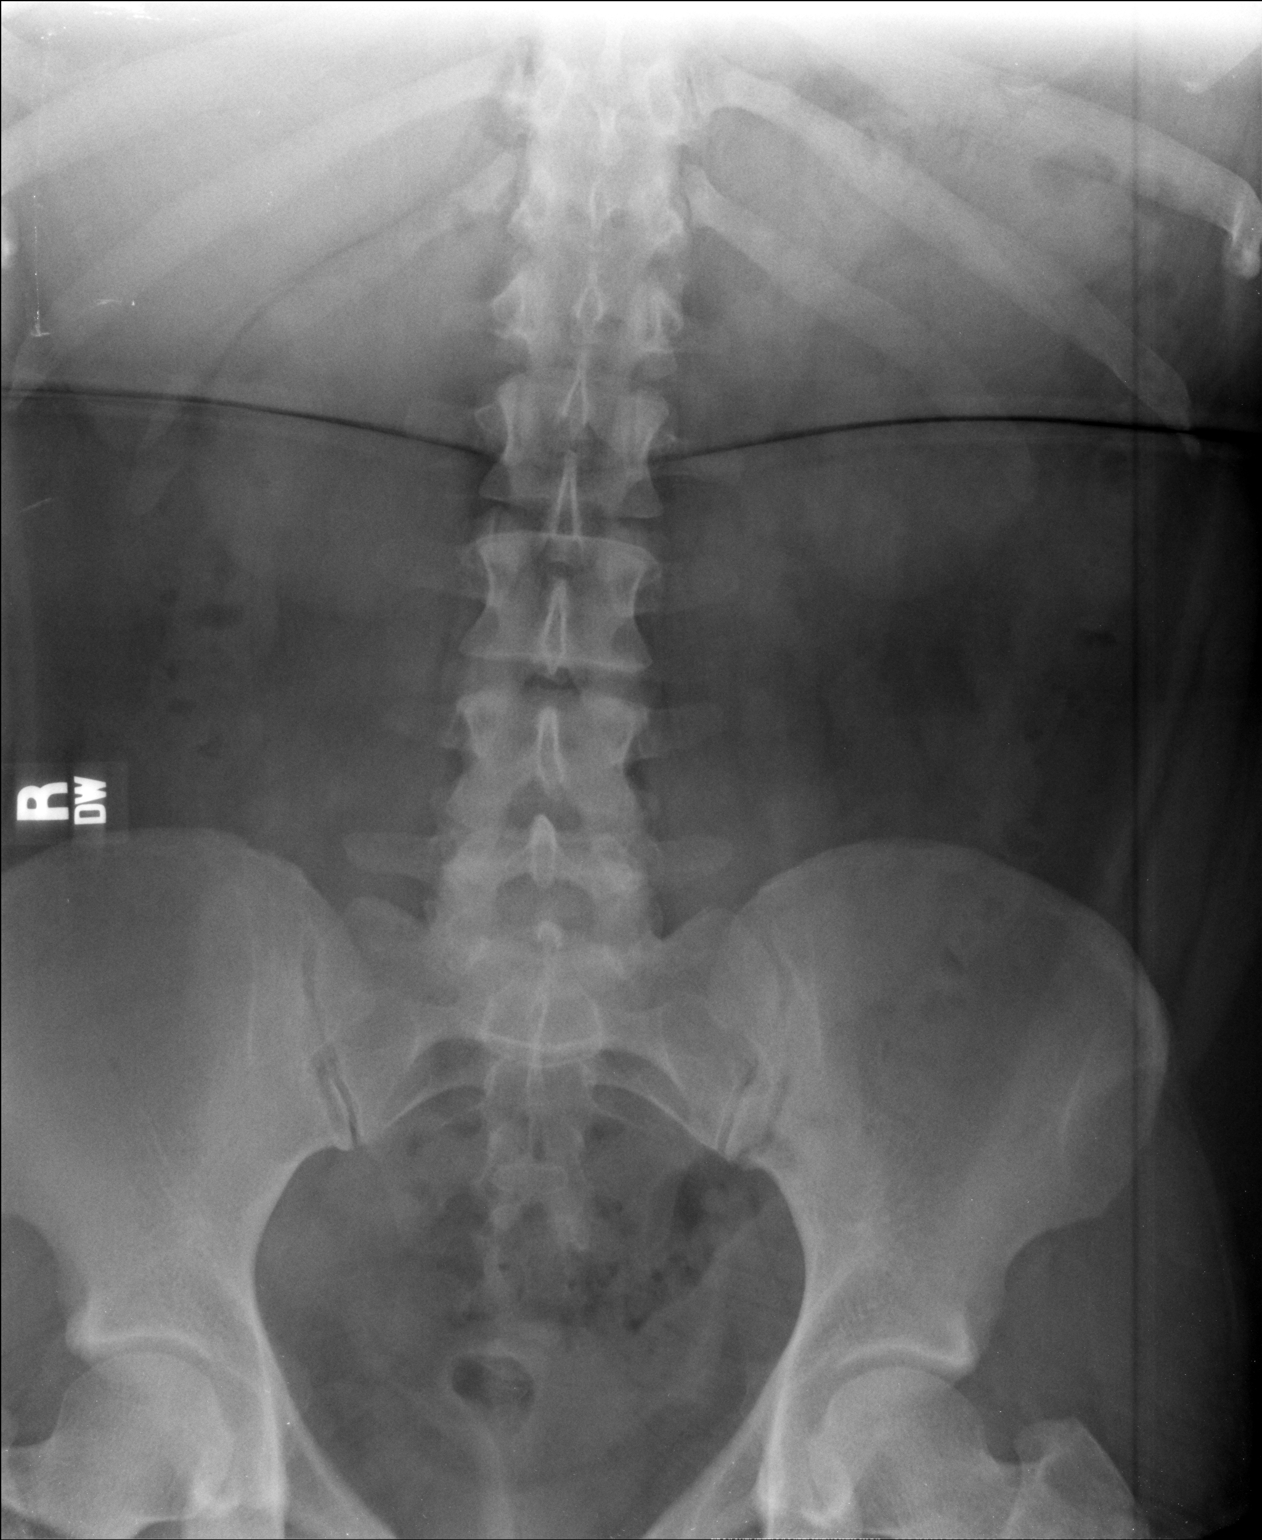

[1 of 1 positions shown; findings below may reference images not displayed]

FINDINGS: There is moderate stool in the colon. There is a paucity of gas. No
free air is seen on this supine examination. There are no abnormal
calcifications.
IMPRESSION: Paucity of gas. While this finding may be seen normally, it may also
be seen with enteritis or early ileus. Obstruction is felt to be
less likely. No free air. Moderate stool present.

## 2020-06-16 ENCOUNTER — Emergency Department (INDEPENDENT_AMBULATORY_CARE_PROVIDER_SITE_OTHER)
Admission: RE | Admit: 2020-06-16 | Discharge: 2020-06-16 | Disposition: A | Discharge status: Home / Self Care | Payer: 59 | Source: Ambulatory Visit

## 2020-06-16 ENCOUNTER — Emergency Department (INDEPENDENT_AMBULATORY_CARE_PROVIDER_SITE_OTHER): Payer: 59

## 2020-06-16 ENCOUNTER — Other Ambulatory Visit: Payer: Self-pay

## 2020-06-16 VITALS — BP 164/104 | HR 91 | Temp 98.2°F | Resp 20

## 2020-06-16 DIAGNOSIS — R05 Cough: Secondary | ICD-10-CM

## 2020-06-16 DIAGNOSIS — J4521 Mild intermittent asthma with (acute) exacerbation: Secondary | ICD-10-CM

## 2020-06-16 DIAGNOSIS — R030 Elevated blood-pressure reading, without diagnosis of hypertension: Secondary | ICD-10-CM

## 2020-06-16 DIAGNOSIS — J069 Acute upper respiratory infection, unspecified: Secondary | ICD-10-CM | POA: Diagnosis not present

## 2020-06-16 HISTORY — DX: Acute myocardial infarction, unspecified: I21.9

## 2020-06-16 MED ORDER — ALBUTEROL SULFATE HFA 108 (90 BASE) MCG/ACT IN AERS: 1.0000 | INHALATION_SPRAY | Freq: Four times a day (QID) | RESPIRATORY_TRACT | 0 refills | Status: AC | PRN

## 2020-06-16 MED ORDER — BENZONATATE 100 MG PO CAPS: 100.0000 mg | ORAL_CAPSULE | Freq: Three times a day (TID) | ORAL | 0 refills | Status: AC

## 2020-06-16 NOTE — ED Notes (Signed)
Date and time results received: 06/16/20 1335 (use smartphrase ".now" to insert current time)  Test: BP reading Critical Value: 160/119  Name of Provider Notified: Doroteo Glassman, [place]

## 2020-06-16 NOTE — ED Provider Notes (Signed)
[facility]    CSN: [id number] Arrival date & time: 06/16/20  1251      History   Chief Complaint Chief Complaint  Patient presents with  . Nasal Congestion    and cough x 3 days    HPI Helen Mcdaniel is a 39 y.o. female.   HPI  Helen Mcdaniel is a 39 y.o. female presenting to UC with c/o 4-5 days of productive cough and nasal congestion, worse over the last 3 days with thick yellow sputum. Denies fever but has had chest soreness and epigastric pain from coughing along with post-tussive vomiting.  She took a home COVID test, which was negative but states a lot of family members have contracted COVID including an in-law who died of COVID in 15-Apr-2020.  Pt and husband have not been vaccinated for COVID. No recent direct contacts, husband has been well.  Hx of asthma, she has not tried inhaler yet because it is out of date.  Hx of MI in her 83s.  Hx of HTN, she has taken her BP medication as prescribed but has also been taking sudafed, last dose last night. Denies HA or dizziness.   Past Medical History:  Diagnosis Date  . Anxiety   . Arthritis   . Asthma   . Depression   . Diabetes mellitus without complication ([place])   . GERD (gastroesophageal reflux disease)   . High blood pressure   . MI (myocardial infarction) ([place])    age 52, one stent placed  . Morbid obesity ([place])   . Overweight(278.02)   . Sinusitis   . Sleep apnea with use of continuous positive airway pressure (CPAP)    01-09-13 , AHI 18.5 , titrated 6 cm , BMI 46.8 .    Patient Active Problem List   Diagnosis Date Noted  . Obesity, morbid, BMI 40.0-49.9 ([place]) 10/12/2013  . Sleep apnea with use of continuous positive airway pressure (CPAP)   . Adjustment disorder with anxiety 11/25/2011    Class: Acute    Past Surgical History:  Procedure Laterality Date  . US ECHOCARDIOGRAPHY  09/14/2009   EF 55-60%    OB History   No obstetric history on file.      Home Medications    Prior to Admission  medications   Medication Sig Start Date End Date Taking? Authorizing Provider  aspirin 81 MG chewable tablet Chew 81 mg by mouth daily.   Yes [provider]  atorvastatin (LIPITOR) 40 MG tablet Take 40 mg by mouth daily.   Yes [provider]  escitalopram (LEXAPRO) 10 MG tablet Take 10 mg by mouth daily.   Yes [provider]  fluticasone (FLONASE) 50 MCG/ACT nasal spray Place 2 sprays into both nostrils daily as needed. 10/11/13  Yes Leandrew Koyanagi, MD  metoprolol succinate (TOPROL-XL) 25 MG 24 hr tablet Take 25 mg by mouth daily.   Yes [provider]  albuterol (VENTOLIN HFA) 108 (90 Base) MCG/ACT inhaler Inhale 1-2 puffs into the lungs every 6 (six) hours as needed for wheezing or shortness of breath. 06/16/20   Noe Gens, PA-C  belladona alk-PHENObarbital [facility]) 16.2 MG tablet 1 ac and hs for 2-4 weeks 02/20/14   Leandrew Koyanagi, MD  benzonatate (TESSALON) 100 MG capsule Take 1-2 capsules (100-200 mg total) by mouth every 8 (eight) hours. 06/16/20   Noe Gens, PA-C  polyethylene glycol powder (GLYCOLAX/MIRALAX) powder Take 17 g by mouth 2 (two) times daily as  needed. 02/20/14   Leandrew Koyanagi, MD  sertraline (ZOLOFT) 50 MG tablet Take 1 tablet (50 mg total) by mouth daily. 05/19/13   Roselee Culver, MD    Family History Family History  Problem Relation Age of Onset  . Hypertension Mother   . Hyperlipidemia Mother   . Heart attack Father   . Coronary artery disease Father   . Hyperlipidemia Father     Social History Social History   Tobacco Use  . Smoking status: Former Smoker    Quit date: 10/09/2007    Years since quitting: 12.6  . Smokeless tobacco: Never Used  Substance Use Topics  . Alcohol use: Not Currently    Comment: occas.  . Drug use: No     Allergies   Ivp dye [iodinated diagnostic agents]   Review of Systems Review of Systems  Constitutional: Negative for chills and fever.  HENT: Positive for  congestion. Negative for ear pain, sore throat, trouble swallowing and voice change.   Respiratory: Positive for cough and chest tightness. Negative for shortness of breath.   Cardiovascular: Positive for chest pain ( soreness with cough). Negative for palpitations.  Gastrointestinal: Positive for abdominal pain (epigastric). Negative for diarrhea, nausea and vomiting.  Musculoskeletal: Negative for arthralgias, back pain and myalgias.  Skin: Negative for rash.  All other systems reviewed and are negative.    Physical Exam Triage Vital Signs ED Triage Vitals  Enc Vitals Group     BP 06/16/20 1325 (!) 174/117     Pulse Rate 06/16/20 1325 91     Resp 06/16/20 1325 20     Temp 06/16/20 1329 98.2 F (36.8 C)     Temp src --      SpO2 06/16/20 1325 96 %     Weight --      Height --      Head Circumference --      Peak Flow --      Pain Score 06/16/20 1315 3     Pain Loc --      Pain Edu? --      Excl. in [place]? --    No data found.  Updated Vital Signs BP (!) 164/104 (BP Location: Right Arm)   Pulse 91   Temp 98.2 F (36.8 C)   Resp 20   LMP 02/14/2020 (Approximate)   SpO2 96%   Visual Acuity Right Eye Distance:   Left Eye Distance:   Bilateral Distance:    Right Eye Near:   Left Eye Near:    Bilateral Near:     Physical Exam Vitals and nursing note reviewed.  Constitutional:      General: She is not in acute distress.    Appearance: Normal appearance. She is well-developed. She is obese. She is not ill-appearing, toxic-appearing or diaphoretic.  HENT:     Head: Normocephalic and atraumatic.     Right Ear: Tympanic membrane and ear canal normal.     Left Ear: Tympanic membrane and ear canal normal.     Nose: Nose normal.     Right Sinus: No maxillary sinus tenderness or frontal sinus tenderness.     Left Sinus: No maxillary sinus tenderness or frontal sinus tenderness.     Mouth/Throat:     Lips: Pink.     Mouth: Mucous membranes are moist.     Pharynx:  Oropharynx is clear. Uvula midline.  Cardiovascular:     Rate and Rhythm: Normal rate and regular rhythm.  Pulmonary:  Effort: Pulmonary effort is normal. No respiratory distress.     Breath sounds: Examination of the right-lower field reveals decreased breath sounds. Examination of the left-lower field reveals decreased breath sounds. Decreased breath sounds and wheezing ( faint diffuse) present. No rhonchi or rales.     Comments: Mildly productive cough during exam, no respiratory distress. Able to speak in full sentences.  Musculoskeletal:        General: Normal range of motion.     Cervical back: Normal range of motion.  Skin:    General: Skin is warm and dry.  Neurological:     Mental Status: She is alert and oriented to person, place, and time.  Psychiatric:        Behavior: Behavior normal.      UC Treatments / Results  Labs (all labs ordered are listed, but only abnormal results are displayed) Labs Reviewed  NOVEL CORONAVIRUS, NAA    EKG Date/Time:06/16/2020    13:49:48 Ventricular Rate: 87 PR Interval: 160 QRS Duration: 96 QT Interval: 376 QTC Calculation: 376 P-R-T axes:  55   85   43 Text Interpretation: Normal sinus rhythm, RSR' or QR patterns in V1 suggests Right ventricular conduction delay. Borderline ECG  No significant change since prior EKG in 2010.  Radiology DG Chest 2 View  Result Date: 06/16/2020 CLINICAL DATA:  Cough EXAM: CHEST - 2 VIEW COMPARISON:  September 03, 2009. FINDINGS: Lungs are clear. Heart size and pulmonary vascularity are normal. No adenopathy. No bone lesions. IMPRESSION: Lungs clear.  Cardiac silhouette within normal limits. Electronically Signed   By: Lowella Grip III M.D.   On: 06/16/2020 14:55    Procedures Procedures (including critical care time)  Medications Ordered in UC Medications - No data to display  Initial Impression / Assessment and Plan / UC Course  I have reviewed the triage vital signs and the  nursing notes.  Pertinent labs & imaging results that were available during my care of the patient were reviewed by me and considered in my medical decision making (see chart for details).    Hx and exam c/w viral URI with asthma exacerbation Elevated BP likely from recent sudafed use and coughing  EKG: no change since prior in 2010. COVID PCR test sent to lab Encouraged symptomatic tx F/u with PCP, may benefit from COVID infusion clinic if COVID comes back positive Discussed symptoms that warrant emergent care in the ED. AVS given   Final Clinical Impressions(s) / UC Diagnoses   Final diagnoses:  Acute upper respiratory infection  Mild intermittent asthma with acute exacerbation  Elevated blood pressure reading     Discharge Instructions     You may take 500mg  acetaminophen every 4-6 hours or in combination with ibuprofen 400-600mg  every 6-8 hours as needed for pain, inflammation, and fever.  Be sure to well hydrated with clear liquids and get at least 8 hours of sleep at night, preferably more while sick.   Please follow up with family medicine in 1 week if not improving, sooner if worsening.  If you do have COVID, talk to your primary care provider about referral to an infusion clinic for treatment. This must be done within 10 days of symptom onset.   Due to concern for possibly having Covid-19, it is advised that you self-isolate at home until test results come back, usually 2-3 days.  If positive, it is recommended you stay isolated for at least 10 days after symptom onset and 24 hours after last fever without  taking medication (whichever is longer).  If you MUST go out, please wear a mask at all times, limit contact with others.   If your test is negative, you still have plenty of time to get the Covid vaccine. It is recommended you schedule an appointment to get your vaccine once you get over this current illness.  Please ask your primary care provider about any  questions/concerns related to the vaccine.    Call 911 or go to the closest hospital if you develop worsening trouble breathing, dizziness/passing out, unable to keep down fluids, or other new concerning symptoms develop.         ED Prescriptions    Medication Sig Dispense Auth. Provider   albuterol (VENTOLIN HFA) 108 (90 Base) MCG/ACT inhaler Inhale 1-2 puffs into the lungs every 6 (six) hours as needed for wheezing or shortness of breath. 18 g Birdie Fetty O, PA-C   benzonatate (TESSALON) 100 MG capsule Take 1-2 capsules (100-200 mg total) by mouth every 8 (eight) hours. 21 capsule Noe Gens, [place]     PDMP not reviewed this encounter.   Noe Gens, [place] 06/16/20 1534

## 2020-06-16 NOTE — Discharge Instructions (Signed)
You may take 500mg  acetaminophen every 4-6 hours or in combination with ibuprofen 400-600mg  every 6-8 hours as needed for pain, inflammation, and fever.  Be sure to well hydrated with clear liquids and get at least 8 hours of sleep at night, preferably more while sick.   Please follow up with family medicine in 1 week if not improving, sooner if worsening.  If you do have COVID, talk to your primary care provider about referral to an infusion clinic for treatment. This must be done within 10 days of symptom onset.   Due to concern for possibly having Covid-19, it is advised that you self-isolate at home until test results come back, usually 2-3 days.  If positive, it is recommended you stay isolated for at least 10 days after symptom onset and 24 hours after last fever without taking medication (whichever is longer).  If you MUST go out, please wear a mask at all times, limit contact with others.   If your test is negative, you still have plenty of time to get the Covid vaccine. It is recommended you schedule an appointment to get your vaccine once you get over this current illness.  Please ask your primary care provider about any questions/concerns related to the vaccine.    Call 911 or go to the closest hospital if you develop worsening trouble breathing, dizziness/passing out, unable to keep down fluids, or other new concerning symptoms develop.

## 2020-06-16 NOTE — ED Triage Notes (Signed)
Patient presents to Urgent Care with complaints of nasal congestion and productive cough x 4-5 days, thick yellow sputum. Afebrile and negative OTC COVID test this morning. Pt reports being unable to keep food down because she is vomiting d/t severe coughing episodes.

## 2020-06-20 LAB — NOVEL CORONAVIRUS, NAA: SARS-CoV-2, NAA: NOT DETECTED

## 2021-07-20 ENCOUNTER — Emergency Department (INDEPENDENT_AMBULATORY_CARE_PROVIDER_SITE_OTHER)
Admission: RE | Admit: 2021-07-20 | Discharge: 2021-07-20 | Disposition: A | Discharge status: Home / Self Care | Payer: 59 | Source: Ambulatory Visit

## 2021-07-20 ENCOUNTER — Other Ambulatory Visit: Payer: Self-pay

## 2021-07-20 VITALS — BP 176/117 | HR 90 | Temp 98.5°F | Resp 17

## 2021-07-20 DIAGNOSIS — S46911A Strain of unspecified muscle, fascia and tendon at shoulder and upper arm level, right arm, initial encounter: Secondary | ICD-10-CM | POA: Diagnosis not present

## 2021-07-20 MED ORDER — BACLOFEN 10 MG PO TABS: 10.0000 mg | ORAL_TABLET | Freq: Three times a day (TID) | ORAL | 0 refills | Status: AC

## 2021-07-20 MED ORDER — CELECOXIB 100 MG PO CAPS: 100.0000 mg | ORAL_CAPSULE | Freq: Two times a day (BID) | ORAL | 0 refills | Status: AC

## 2021-07-20 NOTE — ED Triage Notes (Addendum)
Pt c/o RT shoulder pain since Wed morning. Says the pain is worse when lying down, can't sleep. Also hurts to raise arm above shoulder. 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 Pain 6/10 Heating pad and tylenol prn.

## 2021-07-20 NOTE — ED Provider Notes (Signed)
[facility]    CSN: [id number] Arrival date & time: 07/20/21  1604      History   Chief Complaint Chief Complaint  Patient presents with   Shoulder Pain    RT    HPI SHANTOYA Mcdaniel is a 40 y.o. female.   HPI 40 year old female presents with right shoulder pain since Wednesday morning.  Reports when raising her arm above shoulder.  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  Patient reports left shoulder pain as 6 of 10  Past Medical History:  Diagnosis Date   Anxiety    Arthritis    Asthma    Depression    Diabetes mellitus without complication (HCC)    GERD (gastroesophageal reflux disease)    High blood pressure    MI (myocardial infarction) ([place])    age 55, one stent placed   Morbid obesity ([place])    Overweight(278.02)    Sinusitis    Sleep apnea with use of continuous positive airway pressure (CPAP)    01-09-13 , AHI 18.5 , titrated 6 cm , BMI 46.8 .    Patient Active Problem List   Diagnosis Date Noted   Obesity, morbid, BMI 40.0-49.9 ([place]) 10/12/2013   Sleep apnea with use of continuous positive airway pressure (CPAP)    Adjustment disorder with anxiety 11/25/2011    Class: Acute    Past Surgical History:  Procedure Laterality Date   US ECHOCARDIOGRAPHY  09/14/2009   EF 55-60%    OB History   No obstetric history on file.      Home Medications    Prior to Admission medications   Medication Sig Start Date End Date Taking? Authorizing Provider  baclofen (LIORESAL) 10 MG tablet Take 1 tablet (10 mg total) by mouth 3 (three) times daily. 07/20/21  Yes Eliezer Lofts, FNP  celecoxib (CELEBREX) 100 MG capsule Take 1 capsule (100 mg total) by mouth 2 (two) times daily for 14 days. 07/20/21 08/03/21 Yes Eliezer Lofts, FNP  losartan (COZAAR) 50 MG tablet Take 1 tablet by mouth daily. 02/10/19  Yes [provider]  albuterol (VENTOLIN HFA) 108 (90 Base) MCG/ACT inhaler Inhale 1-2 puffs into the lungs every 6 (six) hours as  needed for wheezing or shortness of breath. 06/16/20   Noe Gens, PA-C  aspirin 81 MG chewable tablet Chew 81 mg by mouth daily.    [provider]  atorvastatin (LIPITOR) 40 MG tablet Take 40 mg by mouth daily.    [provider]  [place] alk-PHENObarbital (DONNATAL) 16.2 MG tablet 1 ac and hs for 2-4 weeks 02/20/14   Leandrew Koyanagi, MD  benzonatate (TESSALON) 100 MG capsule Take 1-2 capsules (100-200 mg total) by mouth every 8 (eight) hours. 06/16/20   Noe Gens, PA-C  escitalopram (LEXAPRO) 10 MG tablet Take 10 mg by mouth daily.    [provider]  fluticasone (FLONASE) 50 MCG/ACT nasal spray Place 2 sprays into both nostrils daily as needed. 10/11/13   Leandrew Koyanagi, MD  metoprolol succinate (TOPROL-XL) 25 MG 24 hr tablet Take 25 mg by mouth daily.    [provider]  polyethylene glycol powder (GLYCOLAX/MIRALAX) powder Take 17 g by mouth 2 (two) times daily as needed. 02/20/14   Leandrew Koyanagi, MD  sertraline (ZOLOFT) 50 MG tablet Take 1 tablet (50 mg total) by mouth daily. 05/19/13   Roselee Culver, MD    Family History Family History  Problem Relation Age  of Onset   Hypertension Mother    Hyperlipidemia Mother    Heart attack Father    Coronary artery disease Father    Hyperlipidemia Father     Social History Social History   Tobacco Use   Smoking status: Former    Types: Cigarettes    Quit date: 10/09/2007    Years since quitting: 13.7   Smokeless tobacco: Never  Vaping Use   Vaping Use: Never used  Substance Use Topics   Alcohol use: Not Currently    Comment: occas.   Drug use: No     Allergies   Ivp dye [iodinated diagnostic agents]   Review of Systems Review of Systems  Musculoskeletal:        Right shoulder pain x1 day    Physical Exam Triage Vital Signs ED Triage Vitals  Enc Vitals Group     BP 07/20/21 1624 (!) 176/117     Pulse Rate 07/20/21 1624 90     Resp 07/20/21 1624 17     Temp  07/20/21 1624 98.5 F (36.9 C)     Temp Source 07/20/21 1624 Oral     SpO2 07/20/21 1624 96 %     Weight --      Height --      Head Circumference --      Peak Flow --      Pain Score 07/20/21 1627 6     Pain Loc --      Pain Edu? --      Excl. in [place]? --    No data found.  Updated Vital Signs BP (!) 176/117 (BP Location: Left Arm)   Pulse 90   Temp 98.5 F (36.9 C) (Oral)   Resp 17   SpO2 96%   Physical Exam Vitals and nursing note reviewed.  Constitutional:      Appearance: Normal appearance. She is obese.  HENT:     Head: Normocephalic and atraumatic.     Mouth/Throat:     Mouth: Mucous membranes are moist.     Pharynx: Oropharynx is clear.  Eyes:     Extraocular Movements: Extraocular movements intact.     Conjunctiva/sclera: Conjunctivae normal.     Pupils: Pupils are equal, round, and reactive to light.  Cardiovascular:     Rate and Rhythm: Normal rate and regular rhythm.     Pulses: Normal pulses.     Heart sounds: Normal heart sounds.  Pulmonary:     Effort: Pulmonary effort is normal.     Breath sounds: Normal breath sounds.     Comments: No adventitious breath sounds Musculoskeletal:     Cervical back: Normal range of motion and neck supple.     Comments: Right shoulder (anterior aspect): TTP over GH and AC joints, limited range of motion with forward flexion/extension, horizontal abduction, internal/external rotation  Skin:    General: Skin is warm and dry.  Neurological:     General: No focal deficit present.     Mental Status: She is alert and oriented to person, place, and time. Mental status is at baseline.  Psychiatric:        Mood and Affect: Mood normal.        Behavior: Behavior normal.     UC Treatments / Results  Labs (all labs ordered are listed, but only abnormal results are displayed) Labs Reviewed - No data to display  EKG   Radiology No results found.  Procedures Procedures (including critical care time)  Medications  Ordered in UC Medications - No data to display  Initial Impression / Assessment and Plan / UC Course  I have reviewed the triage vital signs and the nursing notes.  Pertinent labs & imaging results that were available during my care of the patient were reviewed by me and considered in my medical decision making (see chart for details).    MDM: 1.  Strain of right shoulder, initial encounter-Rx'd Celebrex, Baclofen. Advised/instructed patient to take medication as directed with food to completion.  Advised patient may use Baclofen daily, or as needed.  Patient discharged home, hemodynamically stable.  Final Clinical Impressions(s) / UC Diagnoses   Final diagnoses:  Strain of right shoulder, initial encounter     Discharge Instructions      Advised/instructed patient to take medication as directed with food to completion.  Advised patient may use Baclofen daily, or as needed.     ED Prescriptions     Medication Sig Dispense Auth. Provider   baclofen (LIORESAL) 10 MG tablet Take 1 tablet (10 mg total) by mouth 3 (three) times daily. 30 each Eliezer Lofts, FNP   celecoxib (CELEBREX) 100 MG capsule Take 1 capsule (100 mg total) by mouth 2 (two) times daily for 14 days. 28 capsule Eliezer Lofts, FNP      PDMP not reviewed this encounter.   Eliezer Lofts, [place] 07/20/21 1758

## 2021-07-20 NOTE — Discharge Instructions (Addendum)
Advised/instructed patient to take medication as directed with food to completion.  Advised patient may use Baclofen daily, or as needed.

## 2022-05-01 IMAGING — DX DG CHEST 2V
2 series · 2 of 2 positions shown · non-contrast
Comparison: September 03, 2009.

CLINICAL DATA: Cough

EXAM:
CHEST - 2 VIEW

[chest pa]
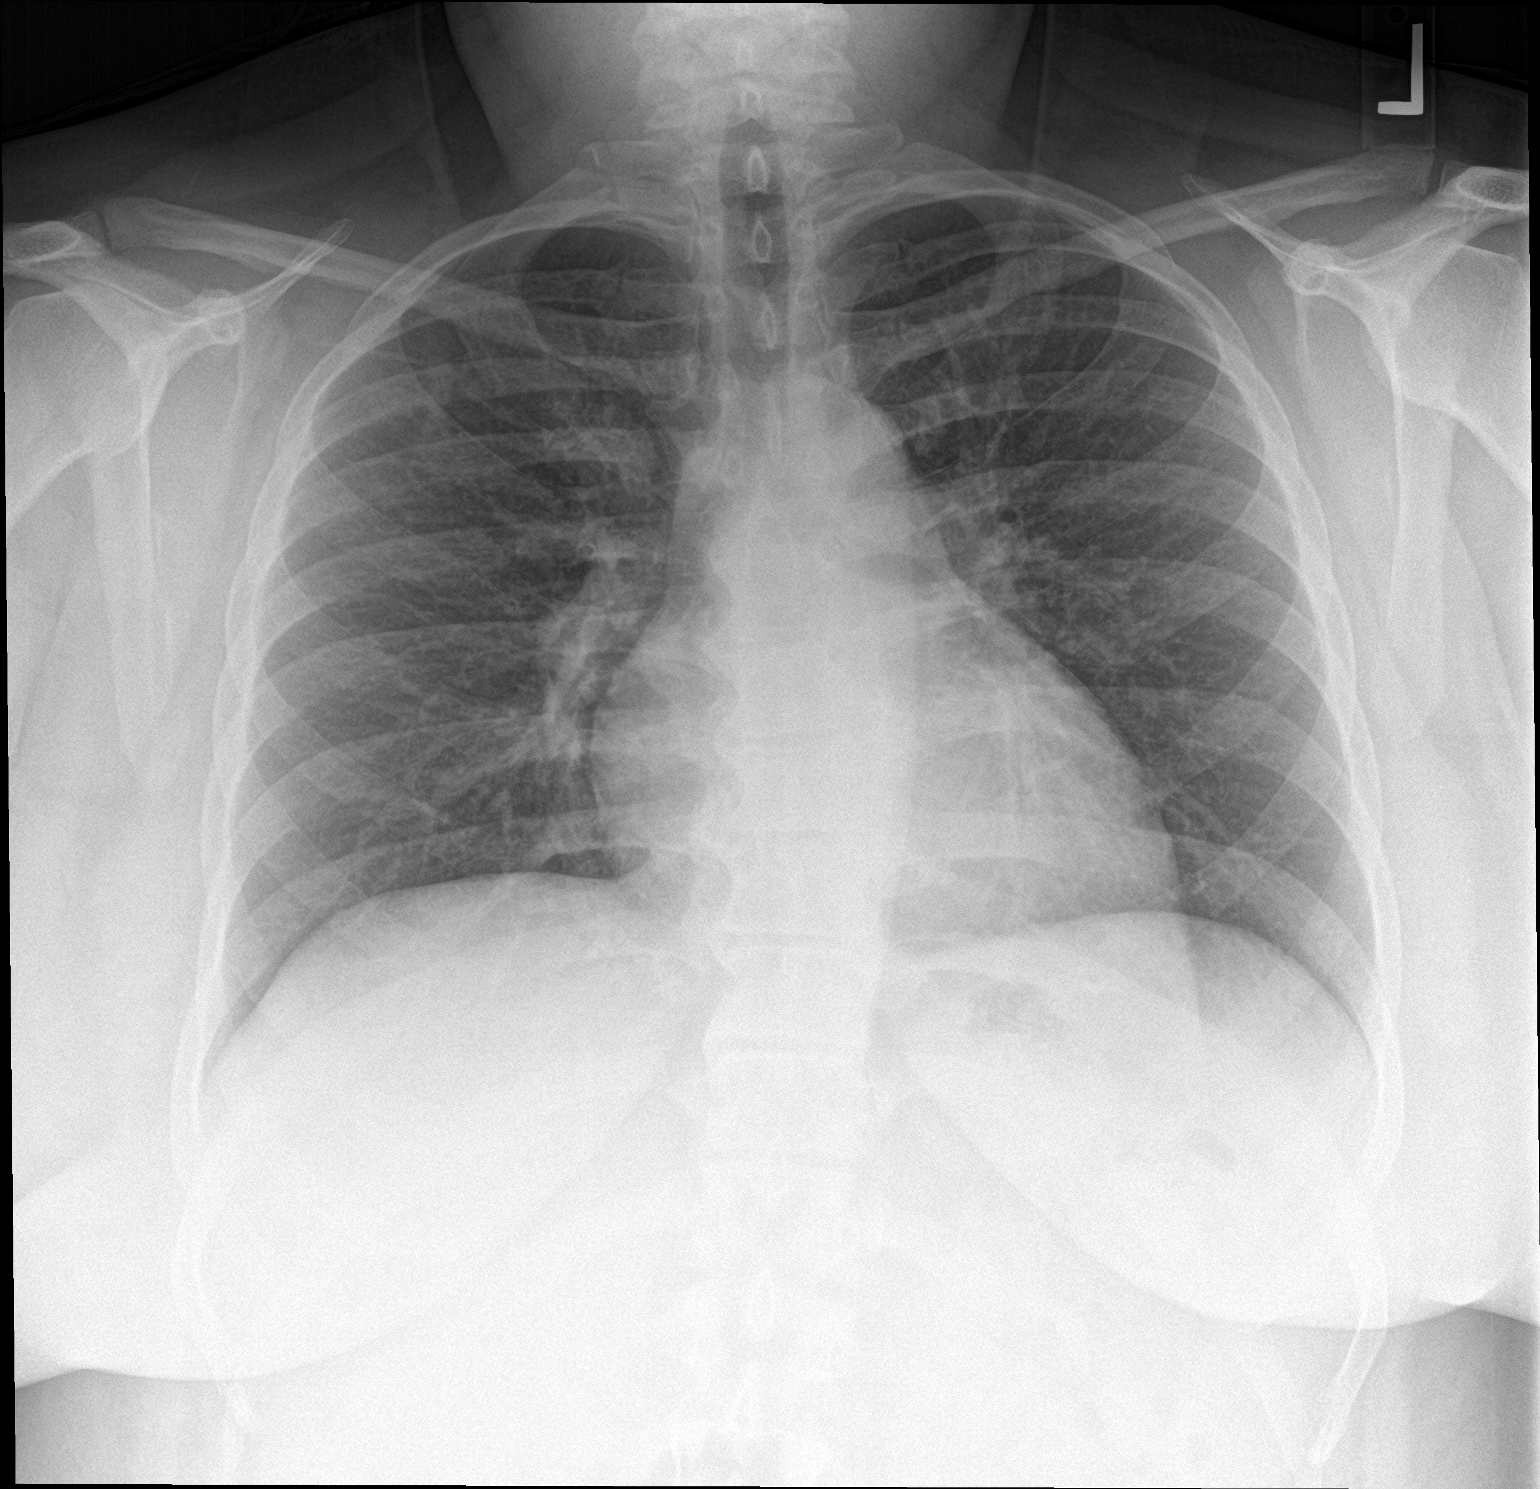

[chest lat]
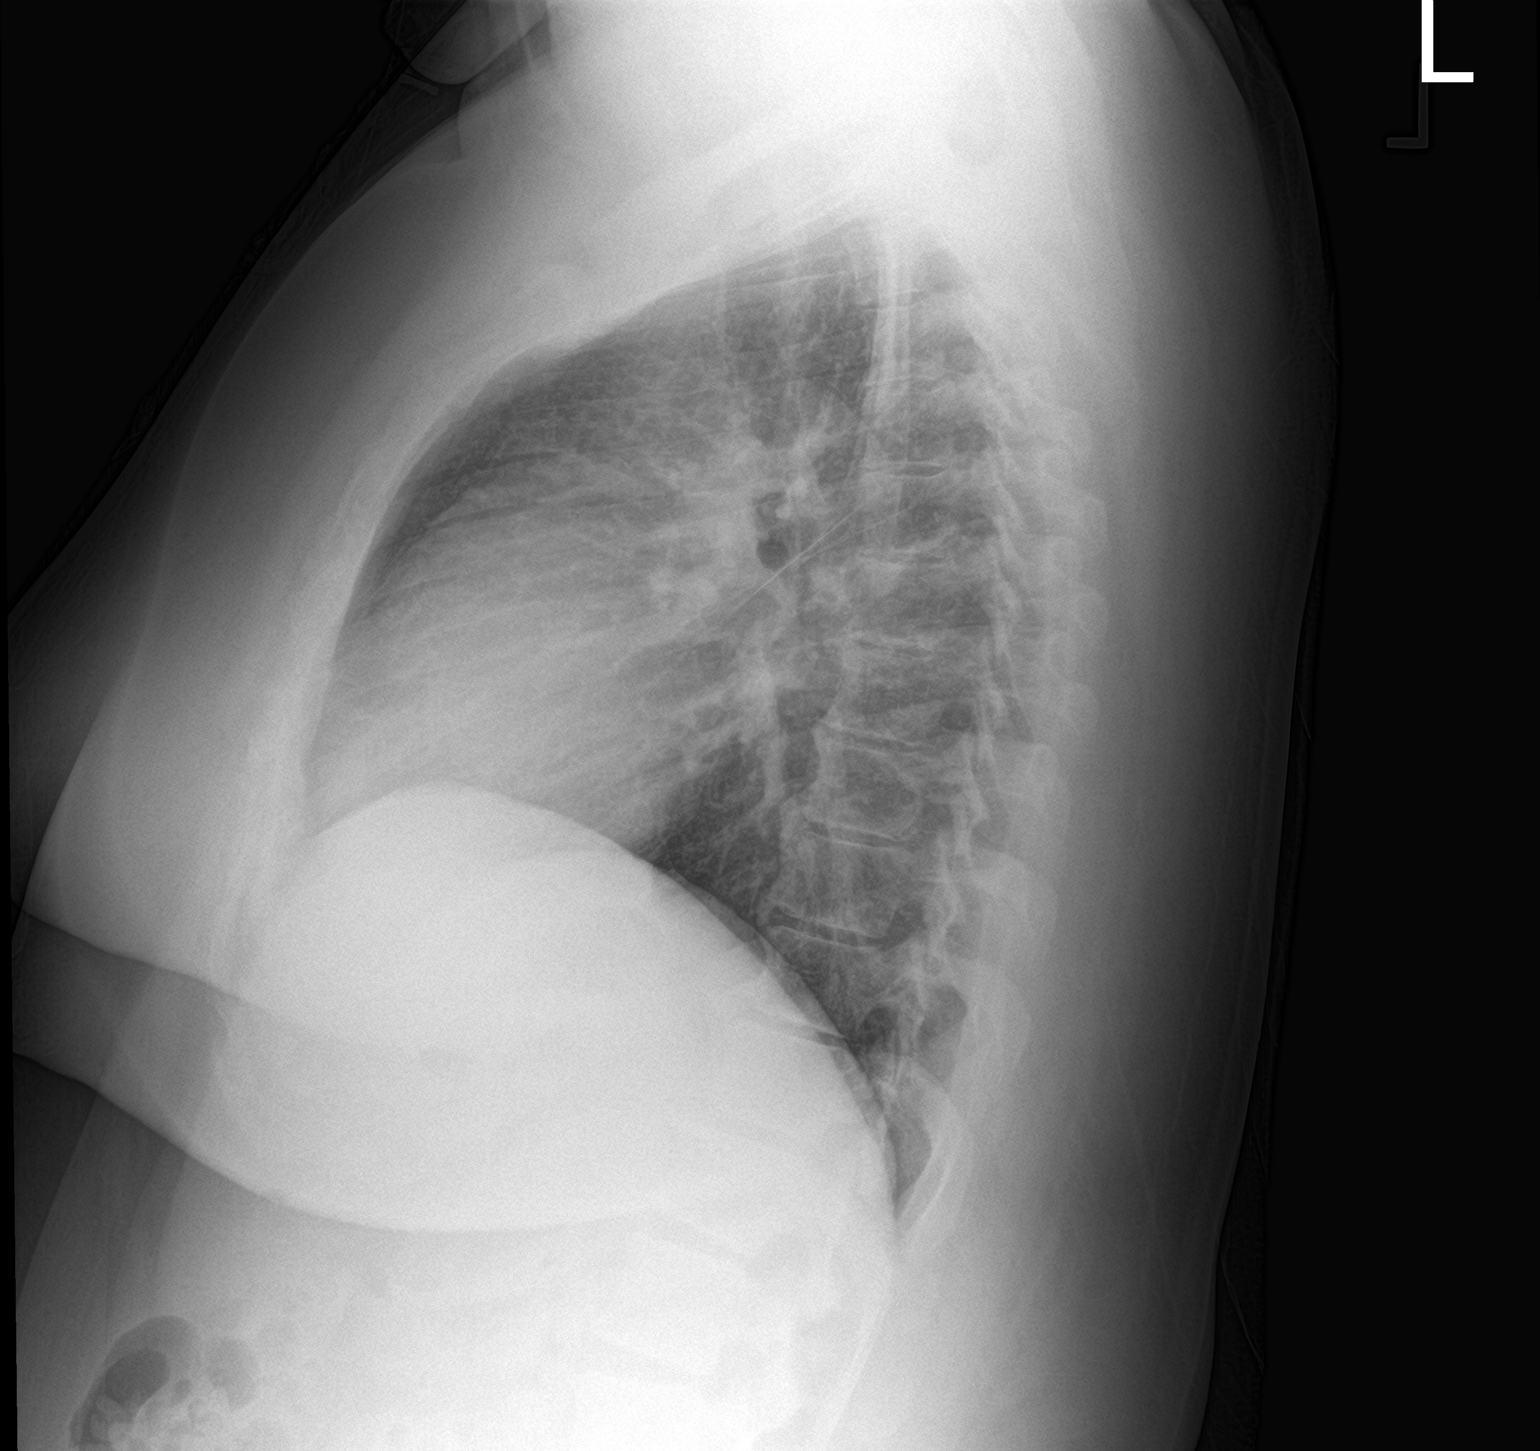

[2 of 2 positions shown; findings below may reference images not displayed]

FINDINGS: Lungs are clear. Heart size and pulmonary vascularity are normal. No
adenopathy. No bone lesions.
IMPRESSION: Lungs clear.  Cardiac silhouette within normal limits.
